# Patient Record
Sex: Female | Born: 1975
Health system: Southern US, Community
[De-identification: ages and names within clinical notes are randomized; demographics above are authoritative.]

## PROBLEM LIST (undated history)

## (undated) DIAGNOSIS — E119 Type 2 diabetes mellitus without complications: Secondary | ICD-10-CM

## (undated) DIAGNOSIS — R87619 Unspecified abnormal cytological findings in specimens from cervix uteri: Secondary | ICD-10-CM

## (undated) DIAGNOSIS — IMO0002 Reserved for concepts with insufficient information to code with codable children: Secondary | ICD-10-CM

## (undated) DIAGNOSIS — Z808 Family history of malignant neoplasm of other organs or systems: Secondary | ICD-10-CM

## (undated) DIAGNOSIS — D649 Anemia, unspecified: Secondary | ICD-10-CM

## (undated) HISTORY — PX: ADENOIDECTOMY: SHX5191

## (undated) HISTORY — PX: MOUTH SURGERY: SHX715

## (undated) HISTORY — DX: Type 2 diabetes mellitus without complications: E11.9

## (undated) HISTORY — PX: LEEP: SHX91

## (undated) HISTORY — DX: Family history of malignant neoplasm of other organs or systems: Z80.8

## (undated) HISTORY — DX: Anemia, unspecified: D64.9

## (undated) HISTORY — PX: COLPOSCOPY: SHX161

---

## 1998-03-13 ENCOUNTER — Ambulatory Visit (HOSPITAL_COMMUNITY): Admission: RE | Admit: 1998-03-13 | Discharge: 1998-03-13 | Payer: Self-pay | Admitting: *Deleted

## 1998-04-29 ENCOUNTER — Inpatient Hospital Stay (HOSPITAL_COMMUNITY): Admission: AD | Admit: 1998-04-29 | Discharge: 1998-04-29 | Payer: Self-pay | Admitting: *Deleted

## 1998-05-23 ENCOUNTER — Inpatient Hospital Stay (HOSPITAL_COMMUNITY): Admission: AD | Admit: 1998-05-23 | Discharge: 1998-05-23 | Payer: Self-pay | Admitting: *Deleted

## 1998-05-27 ENCOUNTER — Inpatient Hospital Stay (HOSPITAL_COMMUNITY): Admission: AD | Admit: 1998-05-27 | Discharge: 1998-05-30 | Payer: Self-pay | Admitting: *Deleted

## 1998-08-22 ENCOUNTER — Inpatient Hospital Stay (HOSPITAL_COMMUNITY): Admission: AD | Admit: 1998-08-22 | Discharge: 1998-08-22 | Payer: Self-pay | Admitting: *Deleted

## 1998-11-28 ENCOUNTER — Inpatient Hospital Stay (HOSPITAL_COMMUNITY): Admission: AD | Admit: 1998-11-28 | Discharge: 1998-11-28 | Payer: Self-pay | Admitting: *Deleted

## 1999-02-18 ENCOUNTER — Inpatient Hospital Stay (HOSPITAL_COMMUNITY): Admission: AD | Admit: 1999-02-18 | Discharge: 1999-02-18 | Payer: Self-pay | Admitting: *Deleted

## 1999-02-18 ENCOUNTER — Encounter (INDEPENDENT_AMBULATORY_CARE_PROVIDER_SITE_OTHER): Payer: Self-pay

## 1999-02-18 ENCOUNTER — Encounter: Payer: Self-pay | Admitting: *Deleted

## 1999-02-20 ENCOUNTER — Inpatient Hospital Stay (HOSPITAL_COMMUNITY): Admission: AD | Admit: 1999-02-20 | Discharge: 1999-02-20 | Payer: Self-pay | Admitting: *Deleted

## 1999-06-30 ENCOUNTER — Emergency Department (HOSPITAL_COMMUNITY): Admission: EM | Admit: 1999-06-30 | Discharge: 1999-06-30 | Payer: Self-pay | Admitting: Emergency Medicine

## 1999-07-01 ENCOUNTER — Encounter: Payer: Self-pay | Admitting: Emergency Medicine

## 1999-10-07 ENCOUNTER — Inpatient Hospital Stay (HOSPITAL_COMMUNITY): Admission: AD | Admit: 1999-10-07 | Discharge: 1999-10-07 | Payer: Self-pay | Admitting: *Deleted

## 2000-10-22 ENCOUNTER — Other Ambulatory Visit: Admission: RE | Admit: 2000-10-22 | Discharge: 2000-10-22 | Payer: Self-pay | Admitting: Obstetrics and Gynecology

## 2002-02-01 ENCOUNTER — Other Ambulatory Visit: Admission: RE | Admit: 2002-02-01 | Discharge: 2002-02-01 | Payer: Self-pay | Admitting: Obstetrics and Gynecology

## 2003-01-15 ENCOUNTER — Emergency Department (HOSPITAL_COMMUNITY): Admission: EM | Admit: 2003-01-15 | Discharge: 2003-01-15 | Payer: Self-pay | Admitting: Emergency Medicine

## 2004-10-31 ENCOUNTER — Other Ambulatory Visit: Admission: RE | Admit: 2004-10-31 | Discharge: 2004-10-31 | Payer: Self-pay | Admitting: Obstetrics and Gynecology

## 2005-03-29 ENCOUNTER — Emergency Department (HOSPITAL_COMMUNITY): Admission: EM | Admit: 2005-03-29 | Discharge: 2005-03-29 | Payer: Self-pay | Admitting: Emergency Medicine

## 2008-11-16 ENCOUNTER — Emergency Department (HOSPITAL_COMMUNITY): Admission: EM | Admit: 2008-11-16 | Discharge: 2008-11-16 | Payer: Self-pay | Admitting: Emergency Medicine

## 2010-03-19 ENCOUNTER — Emergency Department (HOSPITAL_COMMUNITY): Admission: EM | Admit: 2010-03-19 | Discharge: 2010-03-19 | Payer: Self-pay | Admitting: Emergency Medicine

## 2010-05-09 ENCOUNTER — Emergency Department (HOSPITAL_COMMUNITY): Admission: EM | Admit: 2010-05-09 | Discharge: 2010-05-10 | Payer: Self-pay | Admitting: Emergency Medicine

## 2010-10-23 LAB — WET PREP, GENITAL
Clue Cells Wet Prep HPF POC: NONE SEEN
Trich, Wet Prep: NONE SEEN
Yeast Wet Prep HPF POC: NONE SEEN

## 2010-10-23 LAB — GC/CHLAMYDIA PROBE AMP, GENITAL
Chlamydia, DNA Probe: NEGATIVE
GC Probe Amp, Genital: NEGATIVE

## 2010-10-23 LAB — URINALYSIS, ROUTINE W REFLEX MICROSCOPIC
Bilirubin Urine: NEGATIVE
Nitrite: NEGATIVE
Specific Gravity, Urine: 1.014 (ref 1.005–1.030)
pH: 6.5 (ref 5.0–8.0)

## 2010-10-23 LAB — HEMOCCULT GUIAC POC 1CARD (OFFICE): Fecal Occult Bld: POSITIVE

## 2011-08-28 ENCOUNTER — Encounter (HOSPITAL_COMMUNITY): Payer: Self-pay | Admitting: *Deleted

## 2011-08-28 ENCOUNTER — Inpatient Hospital Stay (HOSPITAL_COMMUNITY)
Admission: AD | Admit: 2011-08-28 | Discharge: 2011-08-28 | Disposition: A | Discharge status: Home / Self Care | Payer: Medicaid Other | Source: Ambulatory Visit | Attending: Obstetrics and Gynecology | Admitting: Obstetrics and Gynecology

## 2011-08-28 DIAGNOSIS — N938 Other specified abnormal uterine and vaginal bleeding: Secondary | ICD-10-CM | POA: Insufficient documentation

## 2011-08-28 DIAGNOSIS — N949 Unspecified condition associated with female genital organs and menstrual cycle: Secondary | ICD-10-CM | POA: Insufficient documentation

## 2011-08-28 HISTORY — DX: Unspecified abnormal cytological findings in specimens from cervix uteri: R87.619

## 2011-08-28 HISTORY — DX: Reserved for concepts with insufficient information to code with codable children: IMO0002

## 2011-08-28 LAB — CBC
HCT: 42.9 % (ref 36.0–46.0)
Platelets: 345 10*3/uL (ref 150–400)
RDW: 13.3 % (ref 11.5–15.5)
WBC: 7.7 10*3/uL (ref 4.0–10.5)

## 2011-08-28 LAB — TSH: TSH: 0.637 u[IU]/mL (ref 0.350–4.500)

## 2011-08-28 LAB — HEPATITIS B SURFACE ANTIGEN: Hepatitis B Surface Ag: NEGATIVE

## 2011-08-28 MED ORDER — METRONIDAZOLE 500 MG PO TABS: 500.0000 mg | ORAL_TABLET | Freq: Two times a day (BID) | ORAL | Status: AC

## 2011-08-28 MED ORDER — FLUCONAZOLE 200 MG PO TABS: 200.0000 mg | ORAL_TABLET | Freq: Every day | ORAL | Status: AC

## 2011-08-28 NOTE — Progress Notes (Signed)
Patient states the week of Christmas missed 2 days of her BCP. On 12-30 started her period that lasted until 1-3, stopped bleeding for 2 days then started again on 1-6 and lasted until 1-9. Started again on 1-11 like a full period, stopped 1-15 and started again on 1-16. Has been bleeding since and has decreased some today. Having some period like cramping. Some of the bleeding episodes have been heavy, scant today.

## 2011-08-28 NOTE — ED Provider Notes (Signed)
History   Patient reports amenorrhea from April 2012, when she started Micronor after D/C [place], until 08/09/11. Her bleeding has been on/off since then with episodes that are heavier. Today bleeding has stopped. Also reports extra-marital encounter and desires STD screening.  Chief Complaint  Patient presents with  . Vaginal Bleeding   Vaginal Bleeding The patient's primary symptoms include a missed menses and vaginal bleeding.    Pertinent Gynecological History: Bleeding: intermenstrual bleeding Contraception: Micronor DES exposure: denies Blood transfusions: none Sexually transmitted diseases: past history: HSV 2 Previous GYN Procedures: colpo   Last pap: normal   Past Medical History  Diagnosis Date  . Abnormal Pap smear   . Genital herpes     Past Surgical History  Procedure Date  . Colposcopy   . Adenoidectomy   . Mouth surgery   . Leep   . Cesarean section     Family History  Problem Relation Age of Onset  . Anesthesia problems Neg Hx     History  Substance Use Topics  . Smoking status: Current Everyday Smoker -- 1.0 packs/day  . Smokeless tobacco: Never Used  . Alcohol Use: Yes     Rare    Allergies:  Allergies  Allergen Reactions  . Ibuprofen Nausea Only    Abdominal pains.    Prescriptions prior to admission  Medication Sig Dispense Refill  . guaiFENesin (ROBITUSSIN) 100 MG/5ML liquid Take 200 mg by mouth 3 (three) times daily as needed. For cold and flu symptoms.      . norethindrone (ORTHO MICRONOR) 0.35 MG tablet Take 1 tablet by mouth daily.        Review of Systems  HENT: Negative.   Cardiovascular: Negative.   Gastrointestinal: Negative.   Genitourinary: Positive for vaginal bleeding and missed menses.  Skin: Negative.    Physical Exam   Blood pressure 137/89, pulse 113, temperature 98.3 F (36.8 C), temperature source Oral, resp. rate 16, height 5\' 7"  (1.702 m), weight 81.557 kg (179 lb 12.8 oz), SpO2 99.00%.  Physical  Exam  Constitutional: She is oriented to person, place, and time. She appears well-developed.  Neck: No thyromegaly present.  Cardiovascular: Normal rate, regular rhythm and normal heart sounds.   Respiratory: Breath sounds normal.  GI: Soft. Bowel sounds are normal.  Genitourinary: Vagina normal and uterus normal.  Neurological: She is oriented to person, place, and time.  Psychiatric: She has a normal mood and affect.    MAU Course  Procedures     Assessment and Plan  DUB probably secondary to missed pills and increased stress  Wet prep, GC,Chlamydia collected Will check CBC,TSH,RPR,HbSag and HIV Reviewed options for contraception: pt interested in [place]. Risks and benefits reviewed. Pt to follow-up in office for ultrasound and possibly Mirena insertion  Jood Retana A 08/28/2011, 4:35 PM

## 2011-08-29 LAB — GC/CHLAMYDIA PROBE AMP, GENITAL
Chlamydia, DNA Probe: NEGATIVE
GC Probe Amp, Genital: NEGATIVE

## 2011-10-13 ENCOUNTER — Encounter (INDEPENDENT_AMBULATORY_CARE_PROVIDER_SITE_OTHER): Payer: Medicaid Other | Admitting: Obstetrics and Gynecology

## 2011-10-13 DIAGNOSIS — Z0000 Encounter for general adult medical examination without abnormal findings: Secondary | ICD-10-CM

## 2011-10-13 DIAGNOSIS — Z3040 Encounter for surveillance of contraceptives, unspecified: Secondary | ICD-10-CM

## 2012-01-05 ENCOUNTER — Other Ambulatory Visit (INDEPENDENT_AMBULATORY_CARE_PROVIDER_SITE_OTHER): Payer: Medicaid Other

## 2012-01-05 DIAGNOSIS — Z3009 Encounter for other general counseling and advice on contraception: Secondary | ICD-10-CM

## 2012-01-05 MED ORDER — MEDROXYPROGESTERONE ACETATE 150 MG/ML IM SUSP
150.0000 mg | Freq: Once | INTRAMUSCULAR | Status: AC
  Administered 2012-01-05: 150 mg via INTRAMUSCULAR

## 2012-01-05 NOTE — Progress Notes (Unsigned)
Next Depo due-03-28-12

## 2012-01-07 ENCOUNTER — Telehealth: Payer: Self-pay | Admitting: Obstetrics and Gynecology

## 2012-01-07 NOTE — Telephone Encounter (Signed)
Triage/epic 

## 2012-01-08 NOTE — Telephone Encounter (Signed)
TC to pt.   States had eval at another MD. Apology given for not being able to schedule appt.

## 2012-01-14 ENCOUNTER — Encounter: Payer: Self-pay | Admitting: Obstetrics and Gynecology

## 2012-01-14 ENCOUNTER — Ambulatory Visit (INDEPENDENT_AMBULATORY_CARE_PROVIDER_SITE_OTHER): Payer: Medicaid Other | Admitting: Obstetrics and Gynecology

## 2012-01-14 VITALS — BP 114/80 | Temp 98.2°F | Ht 67.0 in | Wt 187.0 lb

## 2012-01-14 DIAGNOSIS — B373 Candidiasis of vulva and vagina: Secondary | ICD-10-CM

## 2012-01-14 DIAGNOSIS — R6889 Other general symptoms and signs: Secondary | ICD-10-CM

## 2012-01-14 DIAGNOSIS — IMO0002 Reserved for concepts with insufficient information to code with codable children: Secondary | ICD-10-CM | POA: Insufficient documentation

## 2012-01-14 DIAGNOSIS — B3731 Acute candidiasis of vulva and vagina: Secondary | ICD-10-CM

## 2012-01-14 DIAGNOSIS — N898 Other specified noninflammatory disorders of vagina: Secondary | ICD-10-CM

## 2012-01-14 LAB — POCT WET PREP (WET MOUNT)
Whiff Test: POSITIVE
pH: 5

## 2012-01-14 LAB — POCT OSOM TRICHOMONAS RAPID TEST: Trichomonas vaginalis: NEGATIVE

## 2012-01-14 MED ORDER — FLUCONAZOLE 150 MG PO TABS: 150.0000 mg | ORAL_TABLET | Freq: Once | ORAL | Status: AC

## 2012-01-14 NOTE — Patient Instructions (Signed)
Avoid: - excess soap on genital area (consider using plain oatmeal soap) - use of powder or sprays in genital area - douching - wearing underwear to bed (except with menses) - using more than is directed detergent when washing clothes - tight fitting garments around genital area - excess sugar intake   

## 2012-01-14 NOTE — Progress Notes (Signed)
36 YO with previous diagnosis for trichomoniasis presents for test of cure. Admits to some continued itching but admits to douching and using a different cleanser for vaginal area. Completed 7 d course of Metronidazole.  O: pH  5.0;  whiff +;  yeast      OSOM (trich)- negative  Pelvic: EGBUS- mild erythema, vagina-copious thin creamy white discharge  A: Yeast Vaginits  P; Diflucan 150 mg #1 1 po stat 1 rf   Ruth Amalfitano, PA-C

## 2012-01-27 ENCOUNTER — Other Ambulatory Visit: Payer: Self-pay

## 2012-01-27 MED ORDER — VALACYCLOVIR HCL 500 MG PO TABS: 500.0000 mg | ORAL_TABLET | Freq: Every day | ORAL | Status: AC

## 2012-03-28 ENCOUNTER — Other Ambulatory Visit: Payer: Medicaid Other

## 2012-04-01 ENCOUNTER — Other Ambulatory Visit (INDEPENDENT_AMBULATORY_CARE_PROVIDER_SITE_OTHER): Payer: Self-pay

## 2012-04-01 DIAGNOSIS — Z3009 Encounter for other general counseling and advice on contraception: Secondary | ICD-10-CM

## 2012-04-01 MED ORDER — MEDROXYPROGESTERONE ACETATE 150 MG/ML IM SUSP
150.0000 mg | Freq: Once | INTRAMUSCULAR | Status: AC
  Administered 2012-04-01: 150 mg via INTRAMUSCULAR

## 2012-04-01 NOTE — Progress Notes (Unsigned)
Next Depo due-06-23-2012

## 2012-06-24 ENCOUNTER — Other Ambulatory Visit (INDEPENDENT_AMBULATORY_CARE_PROVIDER_SITE_OTHER): Payer: BC Managed Care – PPO

## 2012-06-24 DIAGNOSIS — Z3009 Encounter for other general counseling and advice on contraception: Secondary | ICD-10-CM

## 2012-06-24 MED ORDER — MEDROXYPROGESTERONE ACETATE 150 MG/ML IM SUSP
150.0000 mg | Freq: Once | INTRAMUSCULAR | Status: AC
  Administered 2012-06-24: 150 mg via INTRAMUSCULAR

## 2012-06-24 NOTE — Progress Notes (Unsigned)
Next Depo due 09-15-2012

## 2012-09-16 ENCOUNTER — Other Ambulatory Visit: Payer: BC Managed Care – PPO

## 2012-09-16 DIAGNOSIS — Z3009 Encounter for other general counseling and advice on contraception: Secondary | ICD-10-CM

## 2012-09-16 MED ORDER — MEDROXYPROGESTERONE ACETATE 150 MG/ML IM SUSP
150.0000 mg | Freq: Once | INTRAMUSCULAR | Status: AC
  Administered 2012-09-16: 150 mg via INTRAMUSCULAR

## 2012-09-16 NOTE — Progress Notes (Unsigned)
Next Depo due 12-08-2012

## 2012-10-14 ENCOUNTER — Ambulatory Visit: Payer: BC Managed Care – PPO | Admitting: Obstetrics and Gynecology

## 2013-12-18 ENCOUNTER — Ambulatory Visit: Payer: Self-pay | Admitting: *Deleted

## 2014-01-19 ENCOUNTER — Encounter: Payer: BC Managed Care – PPO | Attending: Internal Medicine | Admitting: *Deleted

## 2014-01-19 ENCOUNTER — Encounter: Payer: Self-pay | Admitting: *Deleted

## 2014-01-19 VITALS — Ht 67.0 in | Wt 201.0 lb

## 2014-01-19 DIAGNOSIS — Z713 Dietary counseling and surveillance: Secondary | ICD-10-CM | POA: Diagnosis not present

## 2014-01-19 DIAGNOSIS — E119 Type 2 diabetes mellitus without complications: Secondary | ICD-10-CM

## 2014-01-19 NOTE — Progress Notes (Signed)
Medical Nutrition Therapy:  Appt start time: 0800 end time:  0900.  Assessment:  Patient here today for diabetes education. Patient newly diagnosed with type 2 diabetes with an A1c of 6.5. Her mother and grandmother also have diabetes. She reports that she has struggled to figure out what she can eat, as many foods have carbohydrates in them. She checks her blood glucose several times a week, but not at specific times. Concentrations range from 79-118, with one reading at 139 immediately after a meal. She has also lost about 11 pounds in the last 3 months, but reports that weight loss is challenging due to Depo Provera injections. 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 Her job requires moderate activity (standing, walking).   MEDICATIONS: Janumet daily, for others, see list.    DIETARY INTAKE:   Usual eating pattern includes 3 meals and 2 snacks per day.  24-hr recall:  B ( AM): Sometimes skips, sausage biscuit OR fruit salad OR bacon, eggs with cheese, occasionally grits, cereal, coffee (Splenda, cream)  Snk ( AM): [employer] granola bar, chips, peanut butter crackers, Cheez its/Triscuits  L ( PM): Sandwich OR leftovers OR salad (pepperoni, fruit, croutons, bacon, crispy noodles, ranch dressing), water (with Crystal light mix) Snk ( PM): Sometimes, same D ( PM): Chicken/fish/hamburger, vegetables (1-2), sometimes starch, water Snk ( PM): None usually Beverages: Water with no calorie flavorings, milk, coffee  Usual physical activity: Treadmill 2 times a week 60 minutes, swimming once a week  Estimated energy needs: 1600 calories 180 g carbohydrates 100 g protein 53 g fat  Progress Towards Goal(s):  In progress.   Nutritional Diagnosis:  NB-1.1 Food and nutrition-related knowledge deficit As related to new diagnosed diabetes.  As evidenced by no prior education.    Intervention:  Nutrition counseling. We discussed basic carb counting, including foods with  carbs, label reading, portion size, and meal planning.   Goals:  1. 2-3 carb servings per meal, 1 serving per snack.  2. Aim for moderate weight loss equalling 5-10% of current weight (180-190 pounds).  3. Monitor portion size of carbohydrate foods 4. Read food label 5. Continue exercising regularly, consider increasing frequency.  Handouts given during visit include:  Living well with diabetes booklet  Meal plan card  Monitoring/Evaluation:  Dietary intake, exercise, blood glucose, and body weight prn.

## 2014-06-11 ENCOUNTER — Encounter: Payer: Self-pay | Admitting: *Deleted

## 2015-11-06 ENCOUNTER — Ambulatory Visit (INDEPENDENT_AMBULATORY_CARE_PROVIDER_SITE_OTHER): Payer: BLUE CROSS/BLUE SHIELD | Admitting: Emergency Medicine

## 2015-11-06 VITALS — BP 130/82 | HR 86 | Temp 98.8°F | Resp 20 | Ht 67.0 in | Wt 211.6 lb

## 2015-11-06 DIAGNOSIS — R509 Fever, unspecified: Secondary | ICD-10-CM

## 2015-11-06 DIAGNOSIS — J0100 Acute maxillary sinusitis, unspecified: Secondary | ICD-10-CM | POA: Diagnosis not present

## 2015-11-06 DIAGNOSIS — R0981 Nasal congestion: Secondary | ICD-10-CM

## 2015-11-06 LAB — POCT INFLUENZA A/B
INFLUENZA A, POC: NEGATIVE
INFLUENZA B, POC: NEGATIVE

## 2015-11-06 MED ORDER — AZELASTINE HCL 0.1 % NA SOLN: 2.0000 | Freq: Two times a day (BID) | NASAL | Status: DC

## 2015-11-06 MED ORDER — AMOXICILLIN 875 MG PO TABS: 875.0000 mg | ORAL_TABLET | Freq: Two times a day (BID) | ORAL | Status: DC

## 2015-11-06 NOTE — Patient Instructions (Addendum)
   IF you received an x-ray today, you will receive an invoice from [facility]. Please contact  Radiology at [phone] with questions or concerns regarding your invoice.   IF you received labwork today, you will receive an invoice from [facility]. Please contact [facility] at [phone] with questions or concerns regarding your invoice.   Our billing staff will not be able to assist you with questions regarding bills from these companies.  You will be contacted with the lab results as soon as they are available. The fastest way to get your results is to activate your My Chart account. Instructions are located on the last page of this paperwork. If you have not heard from us regarding the results in 2 weeks, please contact this office.     Sinusitis, Adult Sinusitis is redness, soreness, and inflammation of the paranasal sinuses. Paranasal sinuses are air pockets within the bones of your face. They are located beneath your eyes, in the middle of your forehead, and above your eyes. In healthy paranasal sinuses, mucus is able to drain out, and air is able to circulate through them by way of your nose. However, when your paranasal sinuses are inflamed, mucus and air can become trapped. This can allow bacteria and other germs to grow and cause infection. Sinusitis can develop quickly and last only a short time (acute) or continue over a long period (chronic). Sinusitis that lasts for more than 12 weeks is considered chronic. CAUSES Causes of sinusitis include:  Allergies.  Structural abnormalities, such as displacement of the cartilage that separates your nostrils (deviated septum), which can decrease the air flow through your nose and sinuses and affect sinus drainage.  Functional abnormalities, such as when the small hairs (cilia) that line your sinuses and help remove mucus do not work properly or are not present. SIGNS AND SYMPTOMS Symptoms  of acute and chronic sinusitis are the same. The primary symptoms are pain and pressure around the affected sinuses. Other symptoms include:  Upper toothache.  Earache.  Headache.  Bad breath.  Decreased sense of smell and taste.  A cough, which worsens when you are lying flat.  Fatigue.  Fever.  Thick drainage from your nose, which often is green and may contain pus (purulent).  Swelling and warmth over the affected sinuses. DIAGNOSIS Your health care provider will perform a physical exam. During your exam, your health care provider may perform any of the following to help determine if you have acute sinusitis or chronic sinusitis:  Look in your nose for signs of abnormal growths in your nostrils (nasal polyps).  Tap over the affected sinus to check for signs of infection.  View the inside of your sinuses using an imaging device that has a light attached (endoscope). If your health care provider suspects that you have chronic sinusitis, one or more of the following tests may be recommended:  Allergy tests.  Nasal culture. A sample of mucus is taken from your nose, sent to a lab, and screened for bacteria.  Nasal cytology. A sample of mucus is taken from your nose and examined by your health care provider to determine if your sinusitis is related to an allergy. TREATMENT Most cases of acute sinusitis are related to a viral infection and will resolve on their own within 10 days. Sometimes, medicines are prescribed to help relieve symptoms of both acute and chronic sinusitis. These may include pain medicines, decongestants, nasal steroid sprays, or saline sprays. However, for sinusitis related   to a bacterial infection, your health care provider will prescribe antibiotic medicines. These are medicines that will help kill the bacteria causing the infection. Rarely, sinusitis is caused by a fungal infection. In these cases, your health care provider will prescribe antifungal  medicine. For some cases of chronic sinusitis, surgery is needed. Generally, these are cases in which sinusitis recurs more than 3 times per year, despite other treatments. HOME CARE INSTRUCTIONS  Drink plenty of water. Water helps thin the mucus so your sinuses can drain more easily.  Use a humidifier.  Inhale steam 3-4 times a day (for example, sit in the bathroom with the shower running).  Apply a warm, moist washcloth to your face 3-4 times a day, or as directed by your health care provider.  Use saline nasal sprays to help moisten and clean your sinuses.  Take medicines only as directed by your health care provider.  If you were prescribed either an antibiotic or antifungal medicine, finish it all even if you start to feel better. SEEK IMMEDIATE MEDICAL CARE IF:  You have increasing pain or severe headaches.  You have nausea, vomiting, or drowsiness.  You have swelling around your face.  You have vision problems.  You have a stiff neck.  You have difficulty breathing.   This information is not intended to replace advice given to you by your health care provider. Make sure you discuss any questions you have with your health care provider.   Document Released: 07/27/2005 Document Revised: 08/17/2014 Document Reviewed: 08/11/2011 Elsevier Interactive Patient Education 2016 [employer]  

## 2015-11-06 NOTE — Progress Notes (Signed)
By signing my name below, I, Stann Oresung-Kai Tsai, attest that this documentation has been prepared under the direction and in the presence of Lesle ChrisSteven Audy Dauphine, MD. Electronically Signed: Stann Oresung-Kai Tsai, Scribe. 11/06/2015 , 12:01 PM .  Patient was seen in room 7 .  Chief Complaint:  Chief Complaint  Patient presents with  . Sinusitis    x 4 days    HPI: Ruth Mckinney is a 40 y.o. female who [facility] complaining of sinus issues that started 4 days ago. She informs she thought her sinus issues are going to clear up but then it continues to come back. She noticed a sharp aching pain in her chest 3 days ago but this has mostly resolved. She mentions having a small virus running a low grade fever with some myalgia a few weeks ago. She denies any myalgia or fever now. She's a current smoker, at about a pack a day, but denies smoking since she's been sick. 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   She also informs that her son was sick but his illness has cleared up.   Past Medical History  Diagnosis Date  . Abnormal Pap smear   . Genital herpes   . Anemia    Past Surgical History  Procedure Laterality Date  . Colposcopy    . Adenoidectomy    . Mouth surgery    . Leep    . Cesarean section     Social History   Social History  . Marital Status: Legally Separated    Spouse Name: N/A  . Number of Children: N/A  . Years of Education: N/A   Social History Main Topics  . Smoking status: Current Every Day Smoker -- 1.00 packs/day  . Smokeless tobacco: Never Used  . Alcohol Use: Yes     Comment: Rare  . Drug Use: None  . Sexual Activity: Yes    Birth Control/ Protection: Pill, Injection     Comment: DEPO PROVERA   Other Topics Concern  . None   Social History Narrative   Family History  Problem Relation Age of Onset  . Anesthesia problems Neg Hx   . Hypertension Maternal Grandmother   . Diabetes Maternal Grandmother   . Heart disease  Maternal Grandfather   . Diabetes Maternal Grandfather   . Hypertension Mother   . Diabetes Mother   . Anemia Mother   . Migraines Mother    Allergies  Allergen Reactions  . Ibuprofen Nausea Only    Abdominal pains.   Prior to Admission medications   Medication Sig Start Date End Date Taking? Authorizing Provider  medroxyPROGESTERone (DEPO-PROVERA) 150 MG/ML injection Inject 150 mg into the muscle every 3 (three) months. Reported on 11/06/2015   Yes Historical Provider, MD  pantoprazole (PROTONIX) 40 MG tablet Take 40 mg by mouth daily.   Yes Historical Provider, MD  buPROPion (WELLBUTRIN XL) 150 MG 24 hr tablet Take 150 mg by mouth daily. Reported on 11/06/2015    Historical Provider, MD  guaiFENesin (ROBITUSSIN) 100 MG/5ML liquid Take 200 mg by mouth 3 (three) times daily as needed. Reported on 11/06/2015    Historical Provider, MD  sitaGLIPtin-metformin (JANUMET) 50-500 MG per tablet Take 1 tablet by mouth daily. Reported on 11/06/2015    Historical Provider, MD     ROS:  Constitutional: negative for fever, chills, night sweats, weight changes, or fatigue  HEENT: negative for vision changes, hearing loss, rhinorrhea, ST, epistaxis; positive for  sinus pressure, congestion Cardiovascular: negative for chest pain or palpitations Respiratory: negative for hemoptysis, wheezing, shortness of breath, or cough Abdominal: negative for abdominal pain, nausea, vomiting, diarrhea, or constipation Dermatological: negative for rash Musc: negative for myalgia Neurologic: negative for headache, dizziness, or syncope All other systems reviewed and are otherwise negative with the exception to those above and in the HPI.  PHYSICAL EXAM: Filed Vitals:   11/06/15 1100  BP: 130/82  Pulse: 86  Temp: 98.8 F (37.1 C)  Resp: 20   Body mass index is 33.13 kg/(m^2).   General: Alert, no acute distress HEENT:  Normocephalic, atraumatic, oropharynx patent; Nasal congestion; minimal tenderness over  maxillary sinuses Eye: [facility], [facility] Cardiovascular:  Regular rate and rhythm, no rubs murmurs or gallops.  No Carotid bruits, radial pulse intact. No pedal edema.  Respiratory: Clear to auscultation bilaterally.  No wheezes, rales, or rhonchi.  No cyanosis, no use of accessory musculature Abdominal: No organomegaly, abdomen is soft and non-tender, positive bowel sounds. No masses. Musculoskeletal: Gait intact. No edema, tenderness Skin: No rashes. Neurologic: Facial musculature symmetric. Psychiatric: Patient acts appropriately throughout our interaction.  Lymphatic: No cervical or submandibular lymphadenopathy Genitourinary/Anorectal: No acute findings  LABS: Results for orders placed or performed in visit on 11/06/15  POCT Influenza A/B  Result Value Ref Range   Influenza A, POC Negative Negative   Influenza B, POC Negative Negative    EKG/XRAY:     ASSESSMENT/PLAN:    We'll treat as a sinusitis. Will treat with  Astelin nasal spray along with amoxicillin.I personally performed the services described in this documentation, which was scribed in my presence. The recorded information has been reviewed and is accurate.I personally performed the services described in this documentation, which was scribed in my presence. The recorded information has been reviewed and is accurate.  Gross sideeffects, risk and benefits, and alternatives of medications d/w patient. Patient is aware that all medications have potential sideeffects and we are unable to predict every sideeffect or drug-drug interaction that may occur.  Lesle Chris MD 11/06/2015 12:02 PM

## 2015-11-26 ENCOUNTER — Ambulatory Visit (INDEPENDENT_AMBULATORY_CARE_PROVIDER_SITE_OTHER): Payer: BLUE CROSS/BLUE SHIELD | Admitting: Family Medicine

## 2015-11-26 ENCOUNTER — Ambulatory Visit (INDEPENDENT_AMBULATORY_CARE_PROVIDER_SITE_OTHER): Payer: BLUE CROSS/BLUE SHIELD

## 2015-11-26 DIAGNOSIS — M2550 Pain in unspecified joint: Secondary | ICD-10-CM | POA: Diagnosis not present

## 2015-11-26 DIAGNOSIS — S161XXA Strain of muscle, fascia and tendon at neck level, initial encounter: Secondary | ICD-10-CM | POA: Diagnosis not present

## 2015-11-26 DIAGNOSIS — M25512 Pain in left shoulder: Secondary | ICD-10-CM

## 2015-11-26 DIAGNOSIS — S39012A Strain of muscle, fascia and tendon of lower back, initial encounter: Secondary | ICD-10-CM

## 2015-11-26 MED ORDER — CYCLOBENZAPRINE HCL 5 MG PO TABS
ORAL_TABLET | ORAL | Status: DC
Start: 2015-11-26 — End: 2019-04-25
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
## 2016-10-26 ENCOUNTER — Other Ambulatory Visit: Payer: Self-pay | Admitting: Obstetrics and Gynecology

## 2016-10-26 DIAGNOSIS — R928 Other abnormal and inconclusive findings on diagnostic imaging of breast: Secondary | ICD-10-CM

## 2016-11-09 ENCOUNTER — Ambulatory Visit
Admission: RE | Admit: 2016-11-09 | Discharge: 2016-11-09 | Disposition: A | Discharge status: Home / Self Care | Payer: BLUE CROSS/BLUE SHIELD | Source: Ambulatory Visit | Attending: Obstetrics and Gynecology | Admitting: Obstetrics and Gynecology

## 2016-11-09 DIAGNOSIS — R928 Other abnormal and inconclusive findings on diagnostic imaging of breast: Secondary | ICD-10-CM

## 2018-01-22 ENCOUNTER — Other Ambulatory Visit: Payer: Self-pay | Admitting: Obstetrics and Gynecology

## 2018-01-22 DIAGNOSIS — N6489 Other specified disorders of breast: Secondary | ICD-10-CM

## 2018-01-26 ENCOUNTER — Other Ambulatory Visit: Payer: BLUE CROSS/BLUE SHIELD

## 2018-05-16 ENCOUNTER — Other Ambulatory Visit: Payer: Self-pay

## 2019-04-14 DIAGNOSIS — Z6834 Body mass index (BMI) 34.0-34.9, adult: Secondary | ICD-10-CM | POA: Diagnosis not present

## 2019-04-14 DIAGNOSIS — N898 Other specified noninflammatory disorders of vagina: Secondary | ICD-10-CM | POA: Diagnosis not present

## 2019-04-14 DIAGNOSIS — Z01419 Encounter for gynecological examination (general) (routine) without abnormal findings: Secondary | ICD-10-CM | POA: Diagnosis not present

## 2019-04-25 ENCOUNTER — Other Ambulatory Visit: Payer: Self-pay

## 2019-04-25 ENCOUNTER — Encounter: Payer: Self-pay | Admitting: Nurse Practitioner

## 2019-04-25 ENCOUNTER — Ambulatory Visit: Payer: BC Managed Care – PPO | Admitting: Nurse Practitioner

## 2019-04-25 VITALS — BP 132/80 | HR 101 | Temp 98.3°F | Ht 68.0 in | Wt 218.2 lb

## 2019-04-25 DIAGNOSIS — R631 Polydipsia: Secondary | ICD-10-CM | POA: Diagnosis not present

## 2019-04-25 DIAGNOSIS — R202 Paresthesia of skin: Secondary | ICD-10-CM

## 2019-04-25 DIAGNOSIS — Z87898 Personal history of other specified conditions: Secondary | ICD-10-CM

## 2019-04-25 DIAGNOSIS — R200 Anesthesia of skin: Secondary | ICD-10-CM | POA: Diagnosis not present

## 2019-04-25 DIAGNOSIS — I10 Essential (primary) hypertension: Secondary | ICD-10-CM | POA: Diagnosis not present

## 2019-04-25 DIAGNOSIS — R358 Other polyuria: Secondary | ICD-10-CM | POA: Diagnosis not present

## 2019-04-25 DIAGNOSIS — E559 Vitamin D deficiency, unspecified: Secondary | ICD-10-CM | POA: Diagnosis not present

## 2019-04-25 DIAGNOSIS — R3589 Other polyuria: Secondary | ICD-10-CM

## 2019-04-25 DIAGNOSIS — Z833 Family history of diabetes mellitus: Secondary | ICD-10-CM

## 2019-04-25 DIAGNOSIS — M542 Cervicalgia: Secondary | ICD-10-CM

## 2019-04-25 LAB — POCT URINALYSIS DIPSTICK
Bilirubin, UA: NEGATIVE
Glucose, UA: POSITIVE — AB
Leukocytes, UA: NEGATIVE
Nitrite, UA: NEGATIVE
Protein, UA: NEGATIVE
Spec Grav, UA: 1.01 (ref 1.010–1.025)
Urobilinogen, UA: 0.2 E.U./dL
pH, UA: 5.5 (ref 5.0–8.0)

## 2019-04-25 LAB — POCT UA - MICROALBUMIN
Albumin/Creatinine Ratio, Urine, POC: 300
Creatinine, POC: 200 mg/dL
Microalbumin Ur, POC: 80 mg/L

## 2019-04-25 NOTE — Progress Notes (Signed)
Subjective:     Patient ID: Ruth Mckinney , female    DOB: 02-20-1976 , 43 y.o.   MRN: [id number]   Chief Complaint  Patient presents with  . Establish Care  . family history of diabetes    patient stated a couple years ago she was prediabetic but she changed her diet and lost some weight so she was no longer considerated prediabetic. she started experincing some symptoms recently. she states she has been extremely thirsty, fatigued, vision has become blurry and has had some nerve pain    HPI  She is here to re-establish care had seen Dr. Baird Cancer years ago. Has not seen primary care in several years.  She goes to [facility] seen last year, no pap. On Depo. 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  She does feel she has gained an increase in weight.  She does report having a 20lb weight loss over the last year.  In the last year she has come off the road.  Children 24 and 21.  Single. Has never taken any blood pressure medications in th past.   PMH - prediabetic (took metformin, diet and exercise and did not have to take).  [facility] - mother diabetes, maternal grandmother and grandfather diabetes, uncles with diabetes.  Maternal grandfather with 4 heart attacks with 2 open heart surgeries with the last one triple bypass.  Diabetes She presents for her initial diabetic visit. She has type 2 diabetes mellitus. There are no hypoglycemic associated symptoms. Pertinent negatives for hypoglycemia include no dizziness, headaches, hunger, mood changes or speech difficulty. Associated symptoms include polydipsia, polyphagia and polyuria. Pertinent negatives for diabetes include no fatigue and no weakness. (Bilateral feet swelling.  These symptoms have occurred over the last 3-4 months.) There are no hypoglycemic complications. Pertinent negatives for diabetic complications include no peripheral neuropathy. Risk factors for coronary artery disease include obesity, sedentary lifestyle  and hypertension. When asked about current treatments, none were reported. She has not had a previous visit with a dietitian. ACE inhibitor/angiotensin II receptor blocker: not taking a medication. She does not see a podiatrist.Eye exam is not current.  Neck Pain  This is a chronic problem. The current episode started more than 1 month ago (since march 2020). The problem has been gradually worsening. The pain is present in the anterior neck. Stiffness is present in the morning. Associated symptoms include numbness (right 2nd and 3rd finger). Pertinent negatives include no headaches, leg pain, tingling or weakness. She has tried nothing for the symptoms.     Past Medical History:  Diagnosis Date  . Abnormal Pap smear   . Anemia   . Genital herpes      Family History  Problem Relation Age of Onset  . Hypertension Maternal Grandmother   . Diabetes Maternal Grandmother   . Heart disease Maternal Grandfather   . Diabetes Maternal Grandfather   . Hypertension Mother   . Diabetes Mother   . Anemia Mother   . Migraines Mother   . Anesthesia problems Neg Hx      Current Outpatient Medications:  .  medroxyPROGESTERone (DEPO-PROVERA) 150 MG/ML injection, Inject 150 mg into the muscle every 3 (three) months. Reported on 11/06/2015, Disp: , Rfl:  .  valACYclovir (VALTREX) 500 MG tablet, Take 500 mg by mouth 2 (two) times daily. For 7 days as needed, Disp: , Rfl:    Allergies  Allergen Reactions  . Ibuprofen Nausea Only    Abdominal pains.  Review of Systems  Constitutional: Negative for fatigue.  Respiratory: Negative.   Cardiovascular: Negative.   Endocrine: Positive for polydipsia, polyphagia and polyuria.  Musculoskeletal: Positive for neck pain. Negative for arthralgias, back pain, gait problem, myalgias and neck stiffness.  Neurological: Positive for numbness (right 2nd and 3rd finger). Negative for dizziness, tingling, speech difficulty, weakness and headaches.     Today's  Vitals   04/25/19 1146 04/25/19 1203  BP: (!) 150/100 132/80  Pulse: 98 (!) 101  Temp: 98.3 F (36.8 C)   TempSrc: Oral   SpO2: 98%   Weight: 218 lb 3.2 oz (99 kg)   Height: 5\' 8"  (1.727 m)   PainSc: 0-No pain    Body mass index is 33.18 kg/m.   Objective:  Physical Exam Constitutional:      General: She is not in acute distress.    Appearance: Normal appearance.  Cardiovascular:     Rate and Rhythm: Normal rate and regular rhythm.     Pulses: Normal pulses.     Heart sounds: Normal heart sounds. No murmur.  Pulmonary:     Effort: Pulmonary effort is normal.     Breath sounds: Normal breath sounds.  Skin:    Capillary Refill: Capillary refill takes less than 2 seconds.  Neurological:     General: No focal deficit present.     Mental Status: She is alert and oriented to person, place, and time.     Comments: Negative phalen and tinels sign  Psychiatric:        Mood and Affect: Mood normal.        Behavior: Behavior normal.        Thought Content: Thought content normal.        Judgment: Judgment normal.         Assessment And Plan:     1. Polyuria  Will check HgbA1c to evaluate for diabetes  Encouraged to make sure she is staying well hydrated - Hemoglobin A1c - CMP14 + Anion Gap  2. Polydipsia  See #1 - Hemoglobin A1c - CMP14 + Anion Gap  3. Elevated blood pressure reading in office with diagnosis of hypertension  This is her first office visit here at this office which can potentially cause her blood pressure to be elevated  Will not start a blood pressure medication at this time  She is to check her blood pressure over the next few days - CBC no Diff  4. Neck pain  Tension to neck muscles on palpation  Will check neck xray for structural damage - DG Cervical Spine Complete; Future  5. Numbness  Complaining of numbness to index and middle finger  Will check for metabolic causes  Negative phalen and tinel signs - Vitamin D (25  hydroxy) - TSH - Vitamin B12   Ruth FeltsJanece Raygan Skarda, FNP    THE PATIENT IS ENCOURAGED TO PRACTICE SOCIAL DISTANCING DUE TO THE COVID-19 PANDEMIC.

## 2019-04-26 ENCOUNTER — Other Ambulatory Visit: Payer: Self-pay

## 2019-04-26 LAB — CMP14 + ANION GAP
ALT: 33 IU/L — ABNORMAL HIGH (ref 0–32)
AST: 22 IU/L (ref 0–40)
Albumin/Globulin Ratio: 1.9 (ref 1.2–2.2)
Albumin: 4.8 g/dL (ref 3.8–4.8)
Alkaline Phosphatase: 124 IU/L — ABNORMAL HIGH (ref 39–117)
Anion Gap: 15 mmol/L (ref 10.0–18.0)
BUN/Creatinine Ratio: 9 (ref 9–23)
BUN: 8 mg/dL (ref 6–24)
Bilirubin Total: 0.3 mg/dL (ref 0.0–1.2)
CO2: 20 mmol/L (ref 20–29)
Calcium: 10.8 mg/dL — ABNORMAL HIGH (ref 8.7–10.2)
Chloride: 101 mmol/L (ref 96–106)
Creatinine, Ser: 0.85 mg/dL (ref 0.57–1.00)
GFR calc Af Amer: 96 mL/min/{1.73_m2} (ref >59)
GFR calc non Af Amer: 84 mL/min/{1.73_m2} (ref >59)
Globulin, Total: 2.5 g/dL (ref 1.5–4.5)
Glucose: 309 mg/dL — ABNORMAL HIGH (ref 65–99)
Potassium: 4.5 mmol/L (ref 3.5–5.2)
Sodium: 136 mmol/L (ref 134–144)
Total Protein: 7.3 g/dL (ref 6.0–8.5)

## 2019-04-26 LAB — CBC
Hematocrit: 44.2 % (ref 34.0–46.6)
Hemoglobin: 14.3 g/dL (ref 11.1–15.9)
MCH: 30.4 pg (ref 26.6–33.0)
MCHC: 32.4 g/dL (ref 31.5–35.7)
MCV: 94 fL (ref 79–97)
Platelets: 272 10*3/uL (ref 150–450)
RBC: 4.71 x10E6/uL (ref 3.77–5.28)
RDW: 11.8 % (ref 11.7–15.4)
WBC: 4.4 10*3/uL (ref 3.4–10.8)

## 2019-04-26 LAB — VITAMIN D 25 HYDROXY (VIT D DEFICIENCY, FRACTURES): Vit D, 25-Hydroxy: 14 ng/mL — ABNORMAL LOW (ref 30.0–100.0)

## 2019-04-26 LAB — HEMOGLOBIN A1C
Est. average glucose Bld gHb Est-mCnc: 303 mg/dL
Hgb A1c MFr Bld: 12.2 % — ABNORMAL HIGH (ref 4.8–5.6)

## 2019-04-26 LAB — VITAMIN B12: Vitamin B-12: 427 pg/mL (ref 232–1245)

## 2019-04-26 LAB — TSH: TSH: 1.13 u[IU]/mL (ref 0.450–4.500)

## 2019-04-26 MED ORDER — METFORMIN HCL 500 MG PO TABS: 500.0000 mg | ORAL_TABLET | Freq: Two times a day (BID) | ORAL | 2 refills | Status: DC

## 2019-04-26 MED ORDER — TRESIBA FLEXTOUCH 100 UNIT/ML ~~LOC~~ SOPN: 10.0000 [IU] | PEN_INJECTOR | Freq: Every day | SUBCUTANEOUS | 0 refills | Status: DC

## 2019-04-26 MED ORDER — BLOOD GLUCOSE MONITORING SUPPL DEVI: 0 refills | Status: DC

## 2019-04-26 MED ORDER — VITAMIN D (ERGOCALCIFEROL) 1.25 MG (50000 UNIT) PO CAPS: 50000.0000 [IU] | ORAL_CAPSULE | ORAL | 0 refills | Status: DC

## 2019-04-26 MED ORDER — OZEMPIC (0.25 OR 0.5 MG/DOSE) 2 MG/1.5ML ~~LOC~~ SOPN: 0.5000 mg | PEN_INJECTOR | SUBCUTANEOUS | 0 refills | Status: DC

## 2019-04-27 ENCOUNTER — Ambulatory Visit (INDEPENDENT_AMBULATORY_CARE_PROVIDER_SITE_OTHER): Payer: BC Managed Care – PPO | Admitting: Nurse Practitioner

## 2019-04-27 ENCOUNTER — Encounter: Payer: Self-pay | Admitting: Nurse Practitioner

## 2019-04-27 ENCOUNTER — Other Ambulatory Visit: Payer: Self-pay

## 2019-04-27 ENCOUNTER — Other Ambulatory Visit: Payer: Self-pay | Admitting: Nurse Practitioner

## 2019-04-27 VITALS — BP 130/78 | HR 99 | Temp 98.0°F | Ht 68.0 in | Wt 218.0 lb

## 2019-04-27 DIAGNOSIS — E119 Type 2 diabetes mellitus without complications: Secondary | ICD-10-CM | POA: Diagnosis not present

## 2019-04-27 MED ORDER — ONETOUCH ULTRASOFT LANCETS MISC: 12 refills | Status: DC

## 2019-04-27 MED ORDER — VITAMIN D (ERGOCALCIFEROL) 1.25 MG (50000 UNIT) PO CAPS: 50000.0000 [IU] | ORAL_CAPSULE | ORAL | 0 refills | Status: DC

## 2019-04-27 MED ORDER — ONETOUCH VERIO VI STRP: ORAL_STRIP | 12 refills | Status: DC

## 2019-05-02 ENCOUNTER — Other Ambulatory Visit: Payer: Self-pay | Admitting: Nurse Practitioner

## 2019-05-02 ENCOUNTER — Other Ambulatory Visit: Payer: Self-pay

## 2019-05-02 MED ORDER — TRESIBA FLEXTOUCH 100 UNIT/ML ~~LOC~~ SOPN: 10.0000 [IU] | PEN_INJECTOR | Freq: Every day | SUBCUTANEOUS | 0 refills | Status: DC

## 2019-05-04 ENCOUNTER — Other Ambulatory Visit: Payer: Self-pay

## 2019-05-04 ENCOUNTER — Telehealth: Payer: Self-pay

## 2019-05-04 MED ORDER — TRESIBA FLEXTOUCH 100 UNIT/ML ~~LOC~~ SOPN: 10.0000 [IU] | PEN_INJECTOR | Freq: Every day | SUBCUTANEOUS | 0 refills | Status: DC

## 2019-05-04 NOTE — Telephone Encounter (Signed)
Patient's PA for one touch verio test strips has been submitted through covermymeds. YRL,RMA

## 2019-05-08 ENCOUNTER — Other Ambulatory Visit: Payer: Self-pay

## 2019-05-08 MED ORDER — ONETOUCH DELICA LANCETS 33G MISC: 1.0000 | Freq: Two times a day (BID) | 5 refills | Status: DC

## 2019-05-10 ENCOUNTER — Other Ambulatory Visit: Payer: Self-pay

## 2019-05-10 MED ORDER — CONTOUR MONITOR W/DEVICE KIT: 1.0000 | PACK | Freq: Two times a day (BID) | 1 refills | Status: DC

## 2019-05-10 NOTE — Progress Notes (Signed)
con

## 2019-06-01 ENCOUNTER — Other Ambulatory Visit: Payer: Self-pay | Admitting: Nurse Practitioner

## 2019-06-01 MED ORDER — OZEMPIC (0.25 OR 0.5 MG/DOSE) 2 MG/1.5ML ~~LOC~~ SOPN: 0.5000 mg | PEN_INJECTOR | SUBCUTANEOUS | 0 refills | Status: DC

## 2019-06-08 ENCOUNTER — Other Ambulatory Visit: Payer: Self-pay

## 2019-06-08 MED ORDER — OZEMPIC (0.25 OR 0.5 MG/DOSE) 2 MG/1.5ML ~~LOC~~ SOPN: 0.5000 mg | PEN_INJECTOR | SUBCUTANEOUS | Status: DC

## 2019-06-22 ENCOUNTER — Ambulatory Visit: Payer: BC Managed Care – PPO | Admitting: Nurse Practitioner

## 2019-06-22 ENCOUNTER — Encounter: Payer: Self-pay | Admitting: Nurse Practitioner

## 2019-06-22 ENCOUNTER — Other Ambulatory Visit: Payer: Self-pay

## 2019-06-22 VITALS — BP 122/80 | HR 93 | Temp 98.5°F | Ht 67.8 in | Wt 223.6 lb

## 2019-06-22 DIAGNOSIS — E559 Vitamin D deficiency, unspecified: Secondary | ICD-10-CM

## 2019-06-22 DIAGNOSIS — E119 Type 2 diabetes mellitus without complications: Secondary | ICD-10-CM

## 2019-06-22 MED ORDER — OZEMPIC (1 MG/DOSE) 2 MG/1.5ML ~~LOC~~ SOPN
1.0000 mg | PEN_INJECTOR | SUBCUTANEOUS | 1 refills | Status: DC
Start: 2019-06-22 — End: 2019-09-13

## 2019-06-22 NOTE — Progress Notes (Signed)
Subjective:     Patient ID: Ruth Mckinney , female    DOB: 09-14-1975 , 43 y.o.   MRN: [id number]   Chief Complaint  Patient presents with  . Diabetes    HPI  Wt Readings from Last 3 Encounters: 06/22/19 : 223 lb 9.6 oz (101.4 kg) 04/27/19 : 218 lb (98.9 kg) 04/25/19 : 218 lb 3.2 oz (99 kg)  She has decreased her intake of carbohydrates. She has not been checking her blood sugar regularly.  She is currently on Ozempic 0.68m weekly.  She feels like she has lost inches.   Diabetes She presents for her initial diabetic visit. She has type 2 diabetes mellitus. There are no hypoglycemic associated symptoms. Pertinent negatives for hypoglycemia include no dizziness, hunger, mood changes or speech difficulty. Associated symptoms include polydipsia, polyphagia and polyuria. Pertinent negatives for diabetes include no fatigue. (Increased urination and thirst has improved.  ) There are no hypoglycemic complications. Pertinent negatives for diabetic complications include no peripheral neuropathy. Risk factors for coronary artery disease include obesity, sedentary lifestyle and hypertension. Current diabetic treatment includes oral agent (dual therapy) (she only took the first 30 days of metformin continues to be on [place] she will get full more quickly). She is compliant with treatment most of the time. She has not had a previous visit with a dietitian. An ACE inhibitor/angiotensin II receptor blocker is being taken (not taking a medication). She does not see a podiatrist.Eye exam is not current.     Past Medical History:  Diagnosis Date  . Abnormal Pap smear   . Anemia   . Genital herpes      Family History  Problem Relation Age of Onset  . Hypertension Maternal Grandmother   . Diabetes Maternal Grandmother   . Heart disease Maternal Grandfather   . Diabetes Maternal Grandfather   . Hypertension Mother   . Diabetes Mother   . Anemia Mother   . Migraines Mother   .  Anesthesia problems Neg Hx      Current Outpatient Medications:  .  Blood Glucose Monitoring Suppl (CONTOUR MONITOR) w/Device KIT, 1 Stick by Does not apply route 2 (two) times daily., Disp: 1 kit, Rfl: 1 .  Blood Glucose Monitoring Suppl DEVI, Check blood sugars twice daily, Disp: 1 kit, Rfl: 0 .  glucose blood (ONETOUCH VERIO) test strip, Use as instructed, Disp: 100 each, Rfl: 12 .  insulin degludec (TRESIBA FLEXTOUCH) 100 UNIT/ML SOPN FlexTouch Pen, Inject 0.1 mLs (10 Units total) into the skin at bedtime., Disp: 5 pen, Rfl: 0 .  Lancets (ONETOUCH ULTRASOFT) lancets, Use as instructed, Disp: 100 each, Rfl: 12 .  medroxyPROGESTERone (DEPO-PROVERA) 150 MG/ML injection, Inject 150 mg into the muscle every 3 (three) months. Reported on 11/06/2015, Disp: , Rfl:  .  OneTouch Delica Lancets 340JMISC, 1 Stick by Does not apply route 2 (two) times daily at 10 AM and 5 PM., Disp: 100 each, Rfl: 5 .  Semaglutide,0.25 or 0.5MG/DOS, (OZEMPIC, 0.25 OR 0.5 MG/DOSE,) 2 MG/1.5ML SOPN, Inject 0.5 mg into the skin once a week., Disp: 5 pen, Rfl: 01 .  valACYclovir (VALTREX) 500 MG tablet, Take 500 mg by mouth 2 (two) times daily. For 7 days as needed, Disp: , Rfl:  .  Vitamin D, Ergocalciferol, (DRISDOL) 1.25 MG (50000 UT) CAPS capsule, Take 1 capsule (50,000 Units total) by mouth 2 (two) times a week., Disp: 24 capsule, Rfl: 0 .  metFORMIN (GLUCOPHAGE) 500 MG tablet, Take 1 tablet (500 mg  total) by mouth 2 (two) times daily with a meal. (Patient not taking: Reported on 06/22/2019), Disp: 60 tablet, Rfl: 2   Allergies  Allergen Reactions  . Ibuprofen Nausea Only    Abdominal pains.     Review of Systems  Constitutional: Negative for fatigue.  Respiratory: Negative.   Cardiovascular: Negative.   Endocrine: Positive for polydipsia, polyphagia and polyuria.  Musculoskeletal: Negative for arthralgias, back pain, gait problem, myalgias and neck stiffness.  Neurological: Negative for dizziness and speech  difficulty.     Today's Vitals   06/22/19 1402  BP: 122/80  Pulse: 93  Temp: 98.5 F (36.9 C)  TempSrc: Oral  Weight: 223 lb 9.6 oz (101.4 kg)  Height: 5' 7.8" (1.722 m)  PainSc: 0-No pain   Body mass index is 34.2 kg/m.   Objective:  Physical Exam Constitutional:      General: She is not in acute distress.    Appearance: Normal appearance.  Cardiovascular:     Rate and Rhythm: Normal rate and regular rhythm.     Pulses: Normal pulses.     Heart sounds: Normal heart sounds. No murmur.  Pulmonary:     Effort: Pulmonary effort is normal.     Breath sounds: Normal breath sounds.  Skin:    Capillary Refill: Capillary refill takes less than 2 seconds.  Neurological:     General: No focal deficit present.     Mental Status: She is alert and oriented to person, place, and time.     Comments: Negative phalen and tinels sign  Psychiatric:        Mood and Affect: Mood normal.        Behavior: Behavior normal.        Thought Content: Thought content normal.        Judgment: Judgment normal.         Assessment And Plan:     1. Type 2 diabetes mellitus without complication, without long-term current use of insulin ([place])  She is tolerating [place] and Ozempic well  Will check HgbA1c today  Encouraged to check blood sugar at least once daily  Encouraged to eat 4-5 small meals a day with low fat - Hemoglobin A1c - CMP14+EGFR  2. Vitamin D deficiency  Will check vitamin D level and supplement as needed.     Also encouraged to spend 15 minutes in the sun daily.  - Vitamin D (25 hydroxy)         Minette Brine, FNP    THE PATIENT IS ENCOURAGED TO PRACTICE SOCIAL DISTANCING DUE TO THE COVID-19 PANDEMIC.

## 2019-06-23 LAB — CMP14+EGFR
ALT: 24 IU/L (ref 0–32)
AST: 14 IU/L (ref 0–40)
Albumin/Globulin Ratio: 2 (ref 1.2–2.2)
Albumin: 4.6 g/dL (ref 3.8–4.8)
Alkaline Phosphatase: 118 IU/L — ABNORMAL HIGH (ref 39–117)
BUN/Creatinine Ratio: 13 (ref 9–23)
BUN: 11 mg/dL (ref 6–24)
Bilirubin Total: 0.2 mg/dL (ref 0.0–1.2)
CO2: 20 mmol/L (ref 20–29)
Calcium: 10.5 mg/dL — ABNORMAL HIGH (ref 8.7–10.2)
Chloride: 104 mmol/L (ref 96–106)
Creatinine, Ser: 0.85 mg/dL (ref 0.57–1.00)
GFR calc Af Amer: 96 mL/min/{1.73_m2} (ref >59)
GFR calc non Af Amer: 84 mL/min/{1.73_m2} (ref >59)
Globulin, Total: 2.3 g/dL (ref 1.5–4.5)
Glucose: 158 mg/dL — ABNORMAL HIGH (ref 65–99)
Potassium: 4.1 mmol/L (ref 3.5–5.2)
Sodium: 139 mmol/L (ref 134–144)
Total Protein: 6.9 g/dL (ref 6.0–8.5)

## 2019-06-23 LAB — HEMOGLOBIN A1C
Est. average glucose Bld gHb Est-mCnc: 229 mg/dL
Hgb A1c MFr Bld: 9.6 % — ABNORMAL HIGH (ref 4.8–5.6)

## 2019-06-23 LAB — VITAMIN D 25 HYDROXY (VIT D DEFICIENCY, FRACTURES): Vit D, 25-Hydroxy: 43.3 ng/mL (ref 30.0–100.0)

## 2019-07-27 ENCOUNTER — Ambulatory Visit: Payer: BC Managed Care – PPO | Admitting: Nurse Practitioner

## 2019-07-31 ENCOUNTER — Other Ambulatory Visit: Payer: Self-pay | Admitting: Nurse Practitioner

## 2019-07-31 MED ORDER — VITAMIN D (ERGOCALCIFEROL) 1.25 MG (50000 UNIT) PO CAPS: 50000.0000 [IU] | ORAL_CAPSULE | ORAL | 0 refills | Status: DC

## 2019-09-13 ENCOUNTER — Other Ambulatory Visit: Payer: Self-pay | Admitting: Nurse Practitioner

## 2019-09-13 DIAGNOSIS — E119 Type 2 diabetes mellitus without complications: Secondary | ICD-10-CM

## 2019-10-08 ENCOUNTER — Encounter: Payer: Self-pay | Admitting: Nurse Practitioner

## 2019-10-08 NOTE — Progress Notes (Addendum)
This visit occurred during the SARS-CoV-2 public health emergency.  Safety protocols were in place, including screening questions prior to the visit, additional usage of staff PPE, and extensive cleaning of exam room while observing appropriate contact time as indicated for disinfecting solutions.  Subjective:     Patient ID: Ruth Mckinney , female    DOB: 05/23/1976 , 44 y.o.   MRN: [id number]   Chief Complaint  Patient presents with  . ozempic and tresiba teaching    HPI  Here today for ozempic and tresiba teaching and discussion with her recent HgbA1c being significantly elevated    Past Medical History:  Diagnosis Date  . Abnormal Pap smear   . Anemia   . Genital herpes      Family History  Problem Relation Age of Onset  . Hypertension Maternal Grandmother   . Diabetes Maternal Grandmother   . Heart disease Maternal Grandfather   . Diabetes Maternal Grandfather   . Hypertension Mother   . Diabetes Mother   . Anemia Mother   . Migraines Mother   . Anesthesia problems Neg Hx      Current Outpatient Medications:  .  Blood Glucose Monitoring Suppl DEVI, Check blood sugars twice daily, Disp: 1 kit, Rfl: 0 .  medroxyPROGESTERone (DEPO-PROVERA) 150 MG/ML injection, Inject 150 mg into the muscle every 3 (three) months. Reported on 11/06/2015, Disp: , Rfl:  .  metFORMIN (GLUCOPHAGE) 500 MG tablet, Take 1 tablet (500 mg total) by mouth 2 (two) times daily with a meal. (Patient not taking: Reported on 06/22/2019), Disp: 60 tablet, Rfl: 2 .  valACYclovir (VALTREX) 500 MG tablet, Take 500 mg by mouth 2 (two) times daily. For 7 days as needed, Disp: , Rfl:  .  Blood Glucose Monitoring Suppl (CONTOUR MONITOR) w/Device KIT, 1 Stick by Does not apply route 2 (two) times daily., Disp: 1 kit, Rfl: 1 .  glucose blood (ONETOUCH VERIO) test strip, Use as instructed, Disp: 100 each, Rfl: 12 .  insulin degludec (TRESIBA FLEXTOUCH) 100 UNIT/ML SOPN FlexTouch Pen, Inject 0.1 mLs (10 Units  total) into the skin at bedtime., Disp: 5 pen, Rfl: 0 .  Lancets (ONETOUCH ULTRASOFT) lancets, Use as instructed, Disp: 100 each, Rfl: 12 .  OneTouch Delica Lancets 68T MISC, 1 Stick by Does not apply route 2 (two) times daily at 10 AM and 5 PM., Disp: 100 each, Rfl: 5 .  OZEMPIC, 1 MG/DOSE, 2 MG/1.5ML SOPN, INJECT 1 ML INTO THE SKIN ONCE A WEEK, Disp: 4 mL, Rfl: 0 .  Vitamin D, Ergocalciferol, (DRISDOL) 1.25 MG (50000 UT) CAPS capsule, Take 1 capsule (50,000 Units total) by mouth 2 (two) times a week., Disp: 24 capsule, Rfl: 0   Allergies  Allergen Reactions  . Ibuprofen Nausea Only    Abdominal pains.     Review of Systems  Respiratory: Negative.   Endocrine: Positive for polyuria. Negative for polydipsia and polyphagia.  Hematological: Negative.   Psychiatric/Behavioral: Negative.      Today's Vitals   04/27/19 1444  BP: 130/78  Pulse: 99  Temp: 98 F (36.7 C)  TempSrc: Oral  Weight: 218 lb (98.9 kg)  Height: _0  (1.727 m)  PainSc: 0-No pain   Body mass index is 33.15 kg/m.   Objective:  Physical Exam Constitutional:      General: She is not in acute distress.    Appearance: Normal appearance.  Cardiovascular:     Rate and Rhythm: Normal rate.  Pulmonary:     Effort: Pulmonary  effort is normal. No respiratory distress.  Neurological:     General: No focal deficit present.     Mental Status: She is alert.  Psychiatric:        Mood and Affect: Mood normal.        Behavior: Behavior normal.        Thought Content: Thought content normal.        Judgment: Judgment normal.         Assessment And Plan:     1. Type 2 diabetes mellitus without complication, without long-term current use of insulin ([place])  Newly diagnosed - education on ozempic and metformin  Discussed risks of diabetes on the kidneys, eyes and heart.   Educated on how to inject Ozempic and side effects to include nausea, difficulty swallowing or sudden onset stomach pain.    Minette Brine,  FNP    THE PATIENT IS ENCOURAGED TO PRACTICE SOCIAL DISTANCING DUE TO THE COVID-19 PANDEMIC.  She was here today for Ozempic teaching was a nurse visit was educated on how to inject Ozempic. Questions answered

## 2019-10-19 ENCOUNTER — Other Ambulatory Visit: Payer: Self-pay | Admitting: Nurse Practitioner

## 2019-10-19 DIAGNOSIS — E119 Type 2 diabetes mellitus without complications: Secondary | ICD-10-CM

## 2020-02-08 ENCOUNTER — Ambulatory Visit: Payer: BC Managed Care – PPO | Admitting: Internal Medicine

## 2020-02-08 ENCOUNTER — Encounter: Payer: Self-pay | Admitting: Internal Medicine

## 2020-02-08 ENCOUNTER — Other Ambulatory Visit: Payer: Self-pay

## 2020-02-08 VITALS — BP 118/78 | HR 87 | Ht 67.8 in | Wt 222.0 lb

## 2020-02-08 DIAGNOSIS — Z794 Long term (current) use of insulin: Secondary | ICD-10-CM

## 2020-02-08 DIAGNOSIS — E1165 Type 2 diabetes mellitus with hyperglycemia: Secondary | ICD-10-CM

## 2020-02-08 LAB — POCT GLYCOSYLATED HEMOGLOBIN (HGB A1C): Hemoglobin A1C: 12.6 % — AB (ref 4.0–5.6)

## 2020-02-08 MED ORDER — TRESIBA FLEXTOUCH 200 UNIT/ML ~~LOC~~ SOPN: 16.0000 [IU] | PEN_INJECTOR | Freq: Every day | SUBCUTANEOUS | 3 refills | Status: DC

## 2020-02-08 MED ORDER — ACCU-CHEK GUIDE VI STRP: ORAL_STRIP | 12 refills | Status: DC

## 2020-02-08 MED ORDER — OZEMPIC (1 MG/DOSE) 2 MG/1.5ML ~~LOC~~ SOPN: 1.0000 mg | PEN_INJECTOR | SUBCUTANEOUS | 5 refills | Status: DC

## 2020-02-08 MED ORDER — INSULIN PEN NEEDLE 32G X 4 MM MISC: 3 refills | Status: DC

## 2020-02-08 MED ORDER — ACCU-CHEK GUIDE ME W/DEVICE KIT: PACK | 0 refills | Status: DC

## 2020-02-08 MED ORDER — ACCU-CHEK MULTICLIX LANCETS MISC: 12 refills | Status: DC

## 2020-02-08 NOTE — Addendum Note (Signed)
Addended by: Darliss Ridgel I on: 02/08/2020 01:25 PM   Modules accepted: Orders

## 2020-02-08 NOTE — Progress Notes (Signed)
Patient ID: Ruth Mckinney, female   DOB: [birth year], 44 y.o.   MRN: [id number]   This visit occurred during the SARS-CoV-2 public health emergency.  Safety protocols were in place, including screening questions prior to the visit, additional usage of staff PPE, and extensive cleaning of exam room while observing appropriate contact time as indicated for disinfecting solutions.   HPI: Ruth Mckinney is a 44 y.o.-year-old female, self-referred, for management of DM2, dx in 04/2019, insulin-dependent (but now off insulin), uncontrolled, without long-term complications.  Her mother, Fatima Sanger, is also my patient.  Reviewed HbA1c levels: Lab Results  Component Value Date   HGBA1C 9.6 (H) 06/22/2019   HGBA1C 12.2 (H) 04/25/2019   Pt is currently not on any medications for her diabetes.  She was previously on Metformin, Ozempic, and Tresiba, all started at dx, but not taking any of these now.  She had severe diarrhea with Metformin. She did well on insulin and GLP1 R agonist but could not obtain refills in 10/2019.  Pt checks her sugars seldom - thinks her meter is malfxning: - am: n/c - 2h after b'fast: n/c - before lunch: 236 - 2h after lunch: n/c - before dinner: n/c - 2h after dinner: n/c - bedtime: n/c - nighttime: n/c Lowest sugar was 215; ? hypoglycemia awareness.  Highest sugar was 390s.  Glucometer: One Touch verio  Pt's meals are: - Breakfast: yoghurt, hash browns, biscuits - Lunch: varies - Dinner: varies - Snacks: 2-4  - no CKD, last BUN/creatinine:  Lab Results  Component Value Date   BUN 11 06/22/2019   BUN 8 04/25/2019   CREATININE 0.85 06/22/2019   CREATININE 0.85 04/25/2019   - + HL; last set of lipids: 02/08/2013: 155/77/38/102 No results found for: CHOL, HDL, LDLCALC, LDLDIRECT, TRIG, CHOLHDL  - last eye exam was in 2020. No DR.   - no numbness and tingling in her feet.  Pt has FH of DM in [place] and both maternal grandparents.  She is on  Depo-Provera.  She has no FH of MTC. No FH of pancreatitis.  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  ROS: Constitutional: + Weight gain, no weight loss, + increased appetite, + fatigue, + hot flashes, + increased urination and nocturia, + poor sleep Eyes: no blurry vision, no xerophthalmia ENT: no sore throat, no nodules palpated in neck, no dysphagia, no odynophagia, no hoarseness, no tinnitus, no hypoacusis Cardiovascular: no CP, no SOB, no palpitations, no leg swelling Respiratory: no cough, no SOB, no wheezing Gastrointestinal: no N, no V, + D, no C, + acid reflux Musculoskeletal: no muscle, no joint aches Skin: no rash, + hair loss Neurological: no tremors, no numbness or tingling/no dizziness/no HAs Psychiatric: no depression, no anxiety  Past Medical History:  Diagnosis Date  . Abnormal Pap smear   . Anemia   . Genital herpes    Past Surgical History:  Procedure Laterality Date  . ADENOIDECTOMY    . CESAREAN SECTION    . COLPOSCOPY    . LEEP    . MOUTH SURGERY     Social History   Socioeconomic History  . Marital status: Single    Spouse name: Not on file  . Number of children: 2  . Years of education: Not on file  . Highest education level: Not on file  Occupational History  . Occupation: Coding and billing in [place]  Tobacco Use  . Smoking status: Former Smoker  Packs/day: 1.00    Quit date: 01/04/2019    Years since quitting: 1.0  . Smokeless tobacco: Never Used  Substance and Sexual Activity  . Alcohol use: Yes    Comment: socially   . Drug use: Never  . Sexual activity: Yes    Birth control/protection: Pill, Injection    Comment: DEPO PROVERA  Other Topics Concern  . Not on file  Social History Narrative  . Not on file   Social Determinants of Health   Financial Resource Strain:   . Difficulty of Paying Living Expenses:   Food Insecurity:   . Worried About Programme researcher, broadcasting/film/video in the  Last Year:   . Barista in the Last Year:   Transportation Needs:   . Freight forwarder (Medical):   Marland Kitchen Lack of Transportation (Non-Medical):   Physical Activity:   . Days of Exercise per Week:   . Minutes of Exercise per Session:   Stress:   . Feeling of Stress :   Social Connections:   . Frequency of Communication with Friends and Family:   . Frequency of Social Gatherings with Friends and Family:   . Attends Religious Services:   . Active Member of Clubs or Organizations:   . Attends Banker Meetings:   Marland Kitchen Marital Status:   Intimate Partner Violence:   . Fear of Current or Ex-Partner:   . Emotionally Abused:   Marland Kitchen Physically Abused:   . Sexually Abused:    Current Outpatient Medications on File Prior to Visit  Medication Sig Dispense Refill  . medroxyPROGESTERone (DEPO-PROVERA) 150 MG/ML injection Inject 150 mg into the muscle every 3 (three) months. Reported on 11/06/2015    . valACYclovir (VALTREX) 500 MG tablet Take 500 mg by mouth 2 (two) times daily. For 7 days as needed     No current facility-administered medications on file prior to visit.   Allergies  Allergen Reactions  . Ibuprofen Nausea Only    Abdominal pains.   Family History  Problem Relation Age of Onset  . Hypertension Maternal Grandmother   . Diabetes Maternal Grandmother   . Heart disease Maternal Grandfather   . Diabetes Maternal Grandfather   . Hypertension Mother   . Diabetes Mother   . Anemia Mother   . Migraines Mother   . Anesthesia problems Neg Hx   Also, mother with thyroid cancer.  PE: BP 118/78   Pulse 87   Ht 5' 7.8" (1.722 m)   Wt 222 lb (100.7 kg)   SpO2 97%   BMI 33.95 kg/m  Wt Readings from Last 3 Encounters:  02/08/20 222 lb (100.7 kg)  06/22/19 223 lb 9.6 oz (101.4 kg)  04/27/19 218 lb (98.9 kg)   Constitutional: overweight, in NAD Eyes: PERRLA, EOMI, no exophthalmos ENT: moist mucous membranes, no thyromegaly, no cervical  lymphadenopathy Cardiovascular: RRR, No MRG Respiratory: CTA B Gastrointestinal: abdomen soft, NT, ND, BS+ Musculoskeletal: no deformities, strength intact in all 4 Skin: moist, warm, no rashes Neurological: no tremor with outstretched hands, DTR normal in all 4  ASSESSMENT: 1. DM2, insulin-dependent, uncontrolled, without long-term complications, but with hyperglycemia -However, off insulin now as she could not refill her prescription  PLAN:  1. Patient with relatively recent uncontrolled DM, with an HbA1c today of 12.6% (higher). -Patient was diagnosed with diabetes in 04/2019.  She was started on Metformin, which, however, she could not tolerate due to diarrhea.  She was also started on [place] and Ozempic at that  time.  She tolerated these well and started to feel better on them.  HbA1c also improved.  However, in 10/2019, she went to refill her prescription but could not do so.  She is now 3 months off any diabetic medications.  She is feeling really tired.  She does not check sugars consistently as she feels her meter is malfunctioning.  At today's visit, I advised her to check sugars twice a day and send a prescription for an Accu-Chek guide meter to her pharmacy. -We also discussed about improving her diet and I made specific suggestions.  She also accepts a referral to nutrition today.  Also given written instructions about improving diet. -We also discussed about starting back on insulin Evaristo Bury, since this is covered for her), and also Ozempic.  She tolerated these well in the past and she also felt better while on them.  We will start at a low dose and increase the dose of both gradually. -At next visit, I plan to check her insulin production.  This could not be checked today is her sugars are very high now. -I sent prescriptions for these and for pen needles to her pharmacy. - I suggested to:  Patient Instructions  Please start: - Ozempic 0.5 mg weekly x 4 weeks, then increase to  1 mg weekly - Tresiba 16 units daily and increase by 4 units every 4 days until sugars in am are <130 or you reach 36 units.  Please schedule an appt with Oran Rein with nutrition.  Please let me know if the sugars are consistently <80 or >200.  Please come back for a follow-up appointment in 3 months.  - Strongly advised her to start checking sugars at different times of the day - check 2x a day, rotating checks - discussed about CBG targets for treatment: 80-130 mg/dL before meals and <035 mg/dL after meals; target WSF6C <7%. - given sugar log and advised how to fill it and to bring it at next appt  - given foot care handout and explained the principles  - given instructions for hypoglycemia management "15-15 rule"  - advised for yearly eye exams  - Return to clinic in 3 mo with sugar log   Carlus Pavlov, MD PhD [facility] Endocrinology

## 2020-02-08 NOTE — Patient Instructions (Addendum)
Please start: - Ozempic 0.5 mg weekly x 4 weeks, then increase to 1 mg weekly - Tresiba 16 units daily and increase by 4 units every 4 days until sugars in am are <130 or you reach 36 units.  Please schedule an appt with Oran Rein with nutrition.  Please let me know if the sugars are consistently <80 or >200.  Please come back for a follow-up appointment in 3 months.  PATIENT INSTRUCTIONS FOR TYPE 2 DIABETES:  DIET AND EXERCISE Diet and exercise is an important part of diabetic treatment.  We recommended aerobic exercise in the form of brisk walking (working between 40-60% of maximal aerobic capacity, similar to brisk walking) for 150 minutes per week (such as 30 minutes five days per week) along with 3 times per week performing 'resistance' training (using various gauge rubber tubes with handles) 5-10 exercises involving the major muscle groups (upper body, lower body and core) performing 10-15 repetitions (or near fatigue) each exercise. Start at half the above goal but build slowly to reach the above goals. If limited by weight, joint pain, or disability, we recommend daily walking in a swimming pool with water up to waist to reduce pressure from joints while allow for adequate exercise.    BLOOD GLUCOSES Monitoring your blood glucoses is important for continued management of your diabetes. Please check your blood glucoses 2-4 times a day: fasting, before meals and at bedtime (you can rotate these measurements - e.g. one day check before the 3 meals, the next day check before 2 of the meals and before bedtime, etc.).   HYPOGLYCEMIA (low blood sugar) Hypoglycemia is usually a reaction to not eating, exercising, or taking too much insulin/ other diabetes drugs.  Symptoms include tremors, sweating, hunger, confusion, headache, etc. Treat IMMEDIATELY with 15 grams of Carbs:  4 glucose tablets   cup regular juice/soda  2 tablespoons raisins  4 teaspoons sugar  1 tablespoon  honey Recheck blood glucose in 15 mins and repeat above if still symptomatic/blood glucose <100.  RECOMMENDATIONS TO REDUCE YOUR RISK OF DIABETIC COMPLICATIONS: * Take your prescribed MEDICATION(S) * Follow a DIABETIC diet: Complex carbs, fiber rich foods, (monounsaturated and polyunsaturated) fats * AVOID saturated/trans fats, high fat foods, >2,300 mg salt per day. * EXERCISE at least 5 times a week for 30 minutes or preferably daily.  * DO NOT SMOKE OR DRINK more than 1 drink a day. * Check your FEET every day. Do not wear tightfitting shoes. Contact us if you develop an ulcer * See your EYE doctor once a year or more if needed * Get a FLU shot once a year * Get a PNEUMONIA vaccine once before and once after age 78 years  GOALS:  * Your Hemoglobin A1c of <7%  * fasting sugars need to be <130 * after meals sugars need to be <180 (2h after you start eating) * Your Systolic BP should be 140 or lower  * Your Diastolic BP should be 80 or lower  * Your HDL (Good Cholesterol) should be 40 or higher  * Your LDL (Bad Cholesterol) should be 100 or lower. * Your Triglycerides should be 150 or lower  * Your Urine microalbumin (kidney function) should be <30 * Your Body Mass Index should be 25 or lower    Please consider the following ways to cut down carbs and fat and increase fiber and micronutrients in your diet: - substitute whole grain for white bread or pasta - substitute brown rice for white  rice - substitute 90-calorie flat bread pieces for slices of bread when possible - substitute sweet potatoes or yams for white potatoes - substitute humus for margarine - substitute tofu for cheese when possible - substitute almond or rice milk for regular milk (would not drink soy milk daily due to concern for soy estrogen influence on breast cancer risk) - substitute dark chocolate for other sweets when possible - substitute water - can add lemon or orange slices for taste - for diet sodas  (artificial sweeteners will trick your body that you can eat sweets without getting calories and will lead you to overeating and weight gain in the long run) - do not skip breakfast or other meals (this will slow down the metabolism and will result in more weight gain over time)  - can try smoothies made from fruit and almond/rice milk in am instead of regular breakfast - can also try old-fashioned (not instant) oatmeal made with almond/rice milk in am - order the dressing on the side when eating salad at a restaurant (pour less than half of the dressing on the salad) - eat as little meat as possible - can try juicing, but should not forget that juicing will get rid of the fiber, so would alternate with eating raw veg./fruits or drinking smoothies - use as little oil as possible, even when using olive oil - can dress a salad with a mix of balsamic vinegar and lemon juice, for e.g. - use agave nectar, stevia sugar, or regular sugar rather than artificial sweateners - steam or broil/roast veggies  - snack on veggies/fruit/nuts (unsalted, preferably) when possible, rather than processed foods - reduce or eliminate aspartame in diet (it is in diet sodas, chewing gum, etc) Read the labels!  Try to read Dr. Katherina Right book: "Program for Reversing Diabetes" for other ideas for healthy eating.

## 2020-02-13 ENCOUNTER — Other Ambulatory Visit: Payer: Self-pay

## 2020-02-13 MED ORDER — ACCU-CHEK SOFTCLIX LANCETS MISC: 12 refills | Status: DC

## 2020-02-14 ENCOUNTER — Other Ambulatory Visit: Payer: Self-pay

## 2020-02-14 MED ORDER — OZEMPIC (1 MG/DOSE) 4 MG/3ML ~~LOC~~ SOPN: 1.0000 mg | PEN_INJECTOR | SUBCUTANEOUS | 2 refills | Status: DC

## 2020-03-07 ENCOUNTER — Other Ambulatory Visit: Payer: Self-pay

## 2020-03-07 MED ORDER — BAYER CONTOUR LINK 2.4 W/DEVICE KIT: PACK | 0 refills | Status: DC

## 2020-03-07 MED ORDER — MICROLET LANCETS MISC: 2 refills | Status: DC

## 2020-03-07 MED ORDER — GLUCOSE BLOOD VI STRP: ORAL_STRIP | 2 refills | Status: DC

## 2020-05-17 ENCOUNTER — Ambulatory Visit: Payer: BC Managed Care – PPO | Admitting: Internal Medicine

## 2020-05-17 ENCOUNTER — Encounter: Payer: Self-pay | Admitting: Internal Medicine

## 2020-05-17 ENCOUNTER — Other Ambulatory Visit: Payer: Self-pay

## 2020-05-17 VITALS — BP 138/72 | HR 103 | Ht 67.8 in | Wt 225.8 lb

## 2020-05-17 DIAGNOSIS — R928 Other abnormal and inconclusive findings on diagnostic imaging of breast: Secondary | ICD-10-CM | POA: Diagnosis not present

## 2020-05-17 DIAGNOSIS — E1165 Type 2 diabetes mellitus with hyperglycemia: Secondary | ICD-10-CM

## 2020-05-17 DIAGNOSIS — Z01419 Encounter for gynecological examination (general) (routine) without abnormal findings: Secondary | ICD-10-CM | POA: Diagnosis not present

## 2020-05-17 DIAGNOSIS — Z6835 Body mass index (BMI) 35.0-35.9, adult: Secondary | ICD-10-CM | POA: Diagnosis not present

## 2020-05-17 DIAGNOSIS — Z794 Long term (current) use of insulin: Secondary | ICD-10-CM

## 2020-05-17 DIAGNOSIS — E785 Hyperlipidemia, unspecified: Secondary | ICD-10-CM | POA: Diagnosis not present

## 2020-05-17 DIAGNOSIS — E669 Obesity, unspecified: Secondary | ICD-10-CM

## 2020-05-17 DIAGNOSIS — Z309 Encounter for contraceptive management, unspecified: Secondary | ICD-10-CM | POA: Diagnosis not present

## 2020-05-17 LAB — COMPREHENSIVE METABOLIC PANEL
ALT: 20 U/L (ref 0–35)
AST: 11 U/L (ref 0–37)
Albumin: 4.3 g/dL (ref 3.5–5.2)
Alkaline Phosphatase: 94 U/L (ref 39–117)
BUN: 10 mg/dL (ref 6–23)
CO2: 25 mEq/L (ref 19–32)
Calcium: 10.4 mg/dL (ref 8.4–10.5)
Chloride: 105 mEq/L (ref 96–112)
Creatinine, Ser: 0.85 mg/dL (ref 0.40–1.20)
GFR: 82.35 mL/min (ref >60.00)
Glucose, Bld: 214 mg/dL — ABNORMAL HIGH (ref 70–99)
Potassium: 4.2 mEq/L (ref 3.5–5.1)
Sodium: 137 mEq/L (ref 135–145)
Total Bilirubin: 0.3 mg/dL (ref 0.2–1.2)
Total Protein: 7 g/dL (ref 6.0–8.3)

## 2020-05-17 LAB — LIPID PANEL
Cholesterol: 153 mg/dL (ref 0–200)
HDL: 44.3 mg/dL (ref >39.00)
LDL Cholesterol: 90 mg/dL (ref 0–99)
NonHDL: 108.73
Total CHOL/HDL Ratio: 3
Triglycerides: 96 mg/dL (ref 0.0–149.0)
VLDL: 19.2 mg/dL (ref 0.0–40.0)

## 2020-05-17 LAB — POCT GLYCOSYLATED HEMOGLOBIN (HGB A1C): Hemoglobin A1C: 8.8 % — AB (ref 4.0–5.6)

## 2020-05-17 LAB — MICROALBUMIN / CREATININE URINE RATIO
Creatinine,U: 131.3 mg/dL
Microalb Creat Ratio: 2.7 mg/g (ref 0.0–30.0)
Microalb, Ur: 3.6 mg/dL — ABNORMAL HIGH (ref 0.0–1.9)

## 2020-05-17 LAB — HM PAP SMEAR

## 2020-05-17 MED ORDER — TRESIBA FLEXTOUCH 200 UNIT/ML ~~LOC~~ SOPN: 36.0000 [IU] | PEN_INJECTOR | Freq: Every day | SUBCUTANEOUS | 3 refills | Status: AC

## 2020-05-17 MED ORDER — OZEMPIC (1 MG/DOSE) 4 MG/3ML ~~LOC~~ SOPN: 1.0000 mg | PEN_INJECTOR | SUBCUTANEOUS | 2 refills | Status: AC

## 2020-05-17 NOTE — Progress Notes (Signed)
Patient ID: Ruth Mckinney, female   DOB: 30-Dec-1975, 44 y.o.   MRN: [id number]   This visit occurred during the SARS-CoV-2 public health emergency.  Safety protocols were in place, including screening questions prior to the visit, additional usage of staff PPE, and extensive cleaning of exam room while observing appropriate contact time as indicated for disinfecting solutions.   HPI: Ruth Mckinney is a 44 y.o.-year-old female, initially self-referred, returning for follow-up for DM2, dx in 04/2019, insulin-dependent, uncontrolled, without long-term complications.  Her mother, Larinda Buttery, is also my patient.  Last visit 3.5 months ago.  Reviewed HbA1c levels: Lab Results  Component Value Date   HGBA1C 12.6 (A) 02/08/2020   HGBA1C 9.6 (H) 06/22/2019   HGBA1C 12.2 (H) 04/25/2019   At last visit, she was not on any medications for her diabetes.  Currently on (started 02/2020): - Ozempic 1 mg weekly (was off for 2 weeks - just restarted) - Tresiba 16 >> 36 units daily  She was previously on Metformin, Ozempic, and Tresiba, all started at dx, but she was off of these at last visit She had severe diarrhea with Metformin. She did well on insulin and GLP1 R agonist but could not obtain refills in 10/2019.  She was not checking sugars at last visit as her meter was defective.  She is not checking once a day - in last month: - am: n/c >> 116-144, 170, 180 - 2h after b'fast: n/c - before lunch: 236 >> n/c - 2h after lunch: n/c - before dinner: n/c - 2h after dinner: n/c - bedtime: n/c - nighttime: n/c Lowest sugar was 215 >> 116; ? hypoglycemia awareness.  Highest sugar was 390s >> 400s soon after last OV.  Glucometer: One Touch verio >> True Metrix  Pt's meals are: - Breakfast: yoghurt, hash browns, biscuits - Lunch: varies - Dinner: varies - Snacks: 2-4  -No CKD, last BUN/creatinine:  Lab Results  Component Value Date   BUN 11 06/22/2019   BUN 8 04/25/2019    CREATININE 0.85 06/22/2019   CREATININE 0.85 04/25/2019   -+ HL; last set of lipids: 02/08/2013: 155/77/38/102 No results found for: CHOL, HDL, LDLCALC, LDLDIRECT, TRIG, CHOLHDL  - last eye exam was in 2020: No DR  - no numbness and tingling in her feet.  Pt has FH of DM in [place] and both maternal grandparents.  She continues on Depo-Provera.  She has no FH of MTC. No FH of pancreatitis.  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  ROS: Constitutional: no weight gain/no weight loss, resolved fatigue, no subjective hyperthermia, no subjective hypothermia Eyes: no blurry vision, no xerophthalmia ENT: no sore throat, no nodules palpated in neck, no dysphagia, no odynophagia, no hoarseness Cardiovascular: no CP/no SOB/no palpitations/no leg swelling Respiratory: no cough/no SOB/no wheezing Gastrointestinal: no N/no V/no D/no C/no acid reflux Musculoskeletal: no muscle aches/no joint aches Skin: no rashes, no hair loss Neurological: no tremors/no numbness/no tingling/no dizziness  I reviewed pt's medications, allergies, PMH, social hx, family hx, and changes were documented in the history of present illness. Otherwise, unchanged from my initial visit note.  Past Medical History:  Diagnosis Date  . Abnormal Pap smear   . Anemia   . Genital herpes    Past Surgical History:  Procedure Laterality Date  . ADENOIDECTOMY    . CESAREAN SECTION    . COLPOSCOPY    . LEEP    . MOUTH SURGERY     Social  History   Socioeconomic History  . Marital status: Single    Spouse name: Not on file  . Number of children: 2  . Years of education: Not on file  . Highest education level: Not on file  Occupational History  . Occupation: Coding and billing in [place]  Tobacco Use  . Smoking status: Former Smoker    Packs/day: 1.00    Quit date: 01/04/2019    Years since quitting: 1.3  . Smokeless tobacco: Never Used  Substance and Sexual  Activity  . Alcohol use: Yes    Comment: socially   . Drug use: Never  . Sexual activity: Yes    Birth control/protection: Pill, Injection    Comment: DEPO PROVERA  Other Topics Concern  . Not on file  Social History Narrative  . Not on file   Social Determinants of Health   Financial Resource Strain:   . Difficulty of Paying Living Expenses: Not on file  Food Insecurity:   . Worried About Charity fundraiser in the Last Year: Not on file  . Ran Out of Food in the Last Year: Not on file  Transportation Needs:   . Lack of Transportation (Medical): Not on file  . Lack of Transportation (Non-Medical): Not on file  Physical Activity:   . Days of Exercise per Week: Not on file  . Minutes of Exercise per Session: Not on file  Stress:   . Feeling of Stress : Not on file  Social Connections:   . Frequency of Communication with Friends and Family: Not on file  . Frequency of Social Gatherings with Friends and Family: Not on file  . Attends Religious Services: Not on file  . Active Member of Clubs or Organizations: Not on file  . Attends Archivist Meetings: Not on file  . Marital Status: Not on file  Intimate Partner Violence:   . Fear of Current or Ex-Partner: Not on file  . Emotionally Abused: Not on file  . Physically Abused: Not on file  . Sexually Abused: Not on file   Current Outpatient Medications on File Prior to Visit  Medication Sig Dispense Refill  . Blood Glucose Monitoring Suppl (BAYER CONTOUR LINK 2.4) w/Device KIT Use Bayer Contour link meter to check blood sugar 2 times a day 1 kit 0  . glucose blood test strip Use Bayer Contour test strips to check blood sugar 2 times a day 200 each 2  . insulin degludec (TRESIBA FLEXTOUCH) 200 UNIT/ML FlexTouch Pen Inject 16 Units into the skin daily. 6 pen 3  . Insulin Pen Needle 32G X 4 MM MISC Use 1x a day 100 each 3  . medroxyPROGESTERone (DEPO-PROVERA) 150 MG/ML injection Inject 150 mg into the muscle every 3  (three) months. Reported on 11/06/2015    . Microlet Lancets MISC Use to check blood sugar 2 times a day 200 each 2  . Semaglutide, 1 MG/DOSE, (OZEMPIC, 1 MG/DOSE,) 4 MG/3ML SOPN Inject 0.75 mLs (1 mg total) into the skin once a week. 9 mL 2  . valACYclovir (VALTREX) 500 MG tablet Take 500 mg by mouth 2 (two) times daily. For 7 days as needed     No current facility-administered medications on file prior to visit.   Allergies  Allergen Reactions  . Ibuprofen Nausea Only    Abdominal pains.   Family History  Problem Relation Age of Onset  . Hypertension Maternal Grandmother   . Diabetes Maternal Grandmother   . Heart  disease Maternal Grandfather   . Diabetes Maternal Grandfather   . Hypertension Mother   . Diabetes Mother   . Anemia Mother   . Migraines Mother   . Anesthesia problems Neg Hx   Also, mother with thyroid cancer.  PE: BP 138/72   Pulse (!) 103   Ht 5' 7.8" (1.722 m)   Wt 225 lb 12.8 oz (102.4 kg)   SpO2 97%   BMI 34.54 kg/m  Wt Readings from Last 3 Encounters:  05/17/20 225 lb 12.8 oz (102.4 kg)  02/08/20 222 lb (100.7 kg)  06/22/19 223 lb 9.6 oz (101.4 kg)   Constitutional: overweight, in NAD Eyes: PERRLA, EOMI, no exophthalmos ENT: moist mucous membranes, no thyromegaly, no cervical lymphadenopathy Cardiovascular: tachycardia, RR, No MRG Respiratory: CTA B Gastrointestinal: abdomen soft, NT, ND, BS+ Musculoskeletal: no deformities, strength intact in all 4 Skin: moist, warm, no rashes Neurological: no tremor with outstretched hands, DTR normal in all 4  ASSESSMENT: 1. DM2, insulin-dependent, uncontrolled, without long-term complications, but with hyperglycemia  2.  Hyperlipidemia  3.  Obesity class I  PLAN:  1. Patient with very uncontrolled diabetes, off all of her diabetic medicines for 3 months at last visit, with an HbA1c of 12.6%.  At that time, she was also not checking sugars and I strongly advised her to start.  I sent a prescription for an  Accu-Chek guide meter to her pharmacy.  We also started [place] at a lower dose and also Ozempic at 0.5 mg weekly and advised her to advance to 1 mg weekly. -We discussed at last visit about checking her insulin production but we could not do it at last visit due to the very high blood sugars.  I also referred her to nutrition at last visit.  She did not have this appointment yet. -At this visit, we reviewed together her sugar log.  In the last month, she only checked in the morning.  Sugars started to improve after our last visit and they are almost all at goal in the last month, with only few mild hyperglycemic exceptions.  However, she is not checking sugars later in the day (she was out of strips) but I advised her to restart. - we checked her HbA1c: 8.8% (MUCH better) - based on the improvement in the HbA1c, and the very good blood sugars in the morning, for now, we will continue with the current regimen -She feels much better now and plans to start exercising (walking along with her mom) - I suggested to:  Patient Instructions  Please continue: - Ozempic 1 mg weekly - Tresiba 36 units daily   Please come back for a follow-up appointment in 3 months.  - advised to check sugars at different times of the day - 1-2x a day, rotating check times - advised for yearly eye exams >> she is UTD - return to clinic in 3 months  2. HL -Reviewed latest lipid panel from 2014: LDL slightly high, HDL slightly low -She is not on a statin -We will repeat her lipid panel today (nonfasting - had cheese, coffee)  3.  Obesity class I -Continue Ozempic 1 mg weekly, which should also help with weight loss -gained 3 lbs since last visit, but probably due to improved glucotoxicity   Component     Latest Ref Rng & Units 05/17/2020  Sodium     135 - 145 mEq/L 137  Potassium     3.5 - 5.1 mEq/L 4.2  Chloride  96 - 112 mEq/L 105  CO2     19 - 32 mEq/L 25  Glucose     70 - 99 mg/dL 214 (H)  BUN     6  - 23 mg/dL 10  Creatinine     0.40 - 1.20 mg/dL 0.85  Total Bilirubin     0.2 - 1.2 mg/dL 0.3  Alkaline Phosphatase     39 - 117 U/L 94  AST     0 - 37 U/L 11  ALT     0 - 35 U/L 20  Total Protein     6.0 - 8.3 g/dL 7.0  Albumin     3.5 - 5.2 g/dL 4.3  GFR     >60.00 mL/min 82.35  Calcium     8.4 - 10.5 mg/dL 10.4  Cholesterol     0 - 200 mg/dL 153  Triglycerides     0 - 149 mg/dL 96.0  HDL Cholesterol     >39.00 mg/dL 44.30  VLDL     0.0 - 40.0 mg/dL 19.2  LDL (calc)     0 - 99 mg/dL 90  Total CHOL/HDL Ratio      3  NonHDL      108.73  Microalb, Ur     0.0 - 1.9 mg/dL 3.6 (H)  Creatinine,U     mg/dL 131.3  MICROALB/CREAT RATIO     0.0 - 30.0 mg/g 2.7   Philemon Kingdom, MD PhD [facility] Endocrinology

## 2020-05-17 NOTE — Patient Instructions (Addendum)
Please continue: - Ozempic 1 mg weekly - Tresiba 36 units daily   Please come back for a follow-up appointment in 3 months.

## 2020-05-24 ENCOUNTER — Encounter: Payer: Self-pay | Admitting: Nurse Practitioner

## 2020-08-12 ENCOUNTER — Other Ambulatory Visit: Payer: BC Managed Care – PPO

## 2020-08-30 ENCOUNTER — Ambulatory Visit: Payer: BC Managed Care – PPO | Admitting: Internal Medicine

## 2021-03-06 DIAGNOSIS — M542 Cervicalgia: Secondary | ICD-10-CM | POA: Diagnosis not present

## 2021-05-30 DIAGNOSIS — Z01419 Encounter for gynecological examination (general) (routine) without abnormal findings: Secondary | ICD-10-CM | POA: Diagnosis not present

## 2021-05-30 DIAGNOSIS — Z124 Encounter for screening for malignant neoplasm of cervix: Secondary | ICD-10-CM | POA: Diagnosis not present

## 2021-05-30 DIAGNOSIS — Z3042 Encounter for surveillance of injectable contraceptive: Secondary | ICD-10-CM | POA: Diagnosis not present

## 2021-05-30 DIAGNOSIS — Z6831 Body mass index (BMI) 31.0-31.9, adult: Secondary | ICD-10-CM | POA: Diagnosis not present

## 2021-05-30 DIAGNOSIS — R87810 Cervical high risk human papillomavirus (HPV) DNA test positive: Secondary | ICD-10-CM | POA: Diagnosis not present

## 2021-08-22 DIAGNOSIS — Z8349 Family history of other endocrine, nutritional and metabolic diseases: Secondary | ICD-10-CM | POA: Diagnosis not present

## 2021-08-22 DIAGNOSIS — R5382 Chronic fatigue, unspecified: Secondary | ICD-10-CM | POA: Diagnosis not present

## 2021-08-22 DIAGNOSIS — E1165 Type 2 diabetes mellitus with hyperglycemia: Secondary | ICD-10-CM | POA: Diagnosis not present

## 2021-08-22 DIAGNOSIS — Z13220 Encounter for screening for lipoid disorders: Secondary | ICD-10-CM | POA: Diagnosis not present

## 2021-08-22 DIAGNOSIS — E7800 Pure hypercholesterolemia, unspecified: Secondary | ICD-10-CM | POA: Diagnosis not present

## 2021-08-22 DIAGNOSIS — Z0000 Encounter for general adult medical examination without abnormal findings: Secondary | ICD-10-CM | POA: Diagnosis not present

## 2021-09-26 ENCOUNTER — Other Ambulatory Visit: Payer: Self-pay

## 2021-09-26 ENCOUNTER — Emergency Department (HOSPITAL_COMMUNITY)
Admission: EM | Admit: 2021-09-26 | Discharge: 2021-09-27 | Disposition: A | Discharge status: Home / Self Care | Payer: BC Managed Care – PPO | Attending: Emergency Medicine | Admitting: Emergency Medicine

## 2021-09-26 ENCOUNTER — Encounter (HOSPITAL_COMMUNITY): Payer: Self-pay | Admitting: *Deleted

## 2021-09-26 ENCOUNTER — Emergency Department (HOSPITAL_COMMUNITY): Payer: BC Managed Care – PPO

## 2021-09-26 DIAGNOSIS — Z5321 Procedure and treatment not carried out due to patient leaving prior to being seen by health care provider: Secondary | ICD-10-CM | POA: Diagnosis not present

## 2021-09-26 DIAGNOSIS — M25521 Pain in right elbow: Secondary | ICD-10-CM | POA: Insufficient documentation

## 2021-09-26 DIAGNOSIS — Y92410 Unspecified street and highway as the place of occurrence of the external cause: Secondary | ICD-10-CM | POA: Diagnosis not present
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
## 2021-09-27 NOTE — ED Notes (Signed)
Pt told this NT that the wait was too long and she was going to call an uber and go home. Pt was seen leaving the ED and getting in a car.

## 2021-10-10 DIAGNOSIS — E1165 Type 2 diabetes mellitus with hyperglycemia: Secondary | ICD-10-CM | POA: Diagnosis not present

## 2023-08-16 ENCOUNTER — Other Ambulatory Visit: Payer: Self-pay | Admitting: Obstetrics and Gynecology

## 2023-08-16 DIAGNOSIS — Z1231 Encounter for screening mammogram for malignant neoplasm of breast: Secondary | ICD-10-CM
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
## 2024-02-02 ENCOUNTER — Ambulatory Visit

## 2024-02-03 ENCOUNTER — Ambulatory Visit
Admission: RE | Admit: 2024-02-03 | Discharge: 2024-02-03 | Disposition: A | Discharge status: Home / Self Care | Source: Ambulatory Visit | Attending: Obstetrics and Gynecology | Admitting: Obstetrics and Gynecology

## 2024-02-03 DIAGNOSIS — Z1231 Encounter for screening mammogram for malignant neoplasm of breast: Secondary | ICD-10-CM

## 2024-06-06 ENCOUNTER — Other Ambulatory Visit: Payer: Self-pay | Admitting: Internal Medicine

## 2024-06-06 ENCOUNTER — Encounter: Payer: Self-pay | Admitting: Internal Medicine

## 2024-06-06 DIAGNOSIS — D490 Neoplasm of unspecified behavior of digestive system: Secondary | ICD-10-CM

## 2024-06-06 DIAGNOSIS — C187 Malignant neoplasm of sigmoid colon: Secondary | ICD-10-CM | POA: Insufficient documentation

## 2024-06-07 ENCOUNTER — Encounter: Payer: Self-pay | Admitting: Hematology

## 2024-06-07 ENCOUNTER — Ambulatory Visit
Admission: RE | Admit: 2024-06-07 | Discharge: 2024-06-07 | Disposition: A | Discharge status: Home / Self Care | Source: Ambulatory Visit | Attending: Internal Medicine | Admitting: Internal Medicine

## 2024-06-07 ENCOUNTER — Inpatient Hospital Stay (HOSPITAL_BASED_OUTPATIENT_CLINIC_OR_DEPARTMENT_OTHER): Admitting: Hematology

## 2024-06-07 ENCOUNTER — Inpatient Hospital Stay: Attending: Hematology

## 2024-06-07 VITALS — BP 122/76 | HR 108 | Temp 97.4°F | Resp 19 | Ht 67.0 in | Wt 165.5 lb

## 2024-06-07 DIAGNOSIS — Z8249 Family history of ischemic heart disease and other diseases of the circulatory system: Secondary | ICD-10-CM | POA: Insufficient documentation

## 2024-06-07 DIAGNOSIS — Z79624 Long term (current) use of inhibitors of nucleotide synthesis: Secondary | ICD-10-CM

## 2024-06-07 DIAGNOSIS — K5900 Constipation, unspecified: Secondary | ICD-10-CM

## 2024-06-07 DIAGNOSIS — D500 Iron deficiency anemia secondary to blood loss (chronic): Secondary | ICD-10-CM

## 2024-06-07 DIAGNOSIS — Z793 Long term (current) use of hormonal contraceptives: Secondary | ICD-10-CM

## 2024-06-07 DIAGNOSIS — Z832 Family history of diseases of the blood and blood-forming organs and certain disorders involving the immune mechanism: Secondary | ICD-10-CM | POA: Insufficient documentation

## 2024-06-07 DIAGNOSIS — K5730 Diverticulosis of large intestine without perforation or abscess without bleeding: Secondary | ICD-10-CM

## 2024-06-07 DIAGNOSIS — E119 Type 2 diabetes mellitus without complications: Secondary | ICD-10-CM | POA: Diagnosis not present

## 2024-06-07 DIAGNOSIS — K8020 Calculus of gallbladder without cholecystitis without obstruction: Secondary | ICD-10-CM

## 2024-06-07 DIAGNOSIS — F17290 Nicotine dependence, other tobacco product, uncomplicated: Secondary | ICD-10-CM

## 2024-06-07 DIAGNOSIS — N281 Cyst of kidney, acquired: Secondary | ICD-10-CM | POA: Diagnosis not present

## 2024-06-07 DIAGNOSIS — R634 Abnormal weight loss: Secondary | ICD-10-CM

## 2024-06-07 DIAGNOSIS — Z833 Family history of diabetes mellitus: Secondary | ICD-10-CM | POA: Diagnosis not present

## 2024-06-07 DIAGNOSIS — C187 Malignant neoplasm of sigmoid colon: Secondary | ICD-10-CM | POA: Insufficient documentation

## 2024-06-07 DIAGNOSIS — Z800 Family history of malignant neoplasm of digestive organs: Secondary | ICD-10-CM | POA: Diagnosis not present

## 2024-06-07 DIAGNOSIS — Z886 Allergy status to analgesic agent status: Secondary | ICD-10-CM | POA: Insufficient documentation

## 2024-06-07 DIAGNOSIS — Z79899 Other long term (current) drug therapy: Secondary | ICD-10-CM | POA: Diagnosis not present

## 2024-06-07 DIAGNOSIS — K921 Melena: Secondary | ICD-10-CM | POA: Diagnosis not present

## 2024-06-07 DIAGNOSIS — K56699 Other intestinal obstruction unspecified as to partial versus complete obstruction: Secondary | ICD-10-CM

## 2024-06-07 DIAGNOSIS — Z9089 Acquired absence of other organs: Secondary | ICD-10-CM | POA: Diagnosis not present

## 2024-06-07 DIAGNOSIS — Z7985 Long-term (current) use of injectable non-insulin antidiabetic drugs: Secondary | ICD-10-CM | POA: Diagnosis not present

## 2024-06-07 DIAGNOSIS — D490 Neoplasm of unspecified behavior of digestive system: Secondary | ICD-10-CM

## 2024-06-07 DIAGNOSIS — Z808 Family history of malignant neoplasm of other organs or systems: Secondary | ICD-10-CM

## 2024-06-07 DIAGNOSIS — K769 Liver disease, unspecified: Secondary | ICD-10-CM | POA: Diagnosis not present

## 2024-06-07 LAB — CBC WITH DIFFERENTIAL/PLATELET
Abs Immature Granulocytes: 0.01 K/uL (ref 0.00–0.07)
Basophils Absolute: 0 K/uL (ref 0.0–0.1)
Basophils Relative: 0 %
Eosinophils Absolute: 0.1 K/uL (ref 0.0–0.5)
Eosinophils Relative: 3 %
HCT: 40.1 % (ref 36.0–46.0)
Hemoglobin: 13.2 g/dL (ref 12.0–15.0)
Immature Granulocytes: 0 %
Lymphocytes Relative: 29 %
Lymphs Abs: 1.6 K/uL (ref 0.7–4.0)
MCH: 30 pg (ref 26.0–34.0)
MCHC: 32.9 g/dL (ref 30.0–36.0)
MCV: 91.1 fL (ref 80.0–100.0)
Monocytes Absolute: 0.3 K/uL (ref 0.1–1.0)
Monocytes Relative: 6 %
Neutro Abs: 3.3 K/uL (ref 1.7–7.7)
Neutrophils Relative %: 62 %
Platelets: 225 K/uL (ref 150–400)
RBC: 4.4 MIL/uL (ref 3.87–5.11)
RDW: 13.7 % (ref 11.5–15.5)
WBC: 5.3 K/uL (ref 4.0–10.5)
nRBC: 0 % (ref 0.0–0.2)

## 2024-06-07 LAB — COMPREHENSIVE METABOLIC PANEL WITH GFR
ALT: 8 U/L (ref 0–44)
AST: 19 U/L (ref 15–41)
Albumin: 4.4 g/dL (ref 3.5–5.0)
Alkaline Phosphatase: 129 U/L — ABNORMAL HIGH (ref 38–126)
Anion gap: 11 (ref 5–15)
BUN: <5 mg/dL — ABNORMAL LOW (ref 6–20)
CO2: 24 mmol/L (ref 22–32)
Calcium: 11.4 mg/dL — ABNORMAL HIGH (ref 8.9–10.3)
Chloride: 105 mmol/L (ref 98–111)
Creatinine, Ser: 0.76 mg/dL (ref 0.44–1.00)
GFR, Estimated: >60 mL/min (ref >60)
Glucose, Bld: 119 mg/dL — ABNORMAL HIGH (ref 70–99)
Potassium: 4.3 mmol/L (ref 3.5–5.1)
Sodium: 140 mmol/L (ref 135–145)
Total Bilirubin: 0.4 mg/dL (ref 0.0–1.2)
Total Protein: 7.7 g/dL (ref 6.5–8.1)

## 2024-06-07 LAB — FERRITIN: Ferritin: 98 ng/mL (ref 11–307)

## 2024-06-07 MED ORDER — IOPAMIDOL (ISOVUE-300) INJECTION 61%
100.0000 mL | Freq: Once | INTRAVENOUS | Status: AC | PRN
  Administered 2024-06-07: 100 mL via INTRAVENOUS

## 2024-06-07 NOTE — Progress Notes (Signed)
 [facility]   [phone] [phone]   Clinic New Consult Note   Patient Care Team: Dwight Trula SQUIBB, MD as PCP - General (Internal Medicine) Ardis Evalene CROME, RN as Oncology Nurse Navigator 06/07/2024  CHIEF COMPLAINTS/PURPOSE OF CONSULTATION:  New retired sigmoid colon cancer  REFERRING PHYSICIAN: Dr. Jillene   Discussed the use of AI scribe software for clinical note transcription with the patient, who gave verbal consent to proceed.  History of Present Illness Ruth Mckinney is a 48 year old female with newly diagnosed colon cancer who presents for a new consult. She is accompanied by her brother and her mother.  She was referred by Dr. Jillene, her gastroenterologist, for further evaluation and management of colon cancer.  She has experienced gastrointestinal symptoms for the past year, with significant issues over the last four to five months, including severe constipation alternating with small, frequent bowel movements. Constipation is severely painful with cramps and pain upon sitting. Internal pain occurs during sexual activity, radiating to her rear. Bowel habits have fluctuated between diarrhea and constipation. She has observed blood in her stool, described as clots when wiping. A recent colonoscopy revealed a mass in the sigmoid colon.  She has experienced significant weight loss, from 229 pounds in June of the previous year to 167 pounds recently, totaling approximately 65 pounds. She attributes some weight loss to dental work and the use of Ozempic .  Her energy levels have improved recently with better diabetes control, as her A1c has decreased to 6.7. She is on Tresiba , Ozempic , and atorvastatin for diabetes and cholesterol management.  Family history includes colon cancer in her maternal great-grandfather and thyroid  cancer in her mother. She smoked for over 20 years, quitting in 2020, and drinks alcohol occasionally. No high blood  pressure, heart disease, kidney disease, or eye problems. No current limitations in daily activities or work.     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[place] [employer]  Tobacco Use   Smoking status: Former    Types: Cigarettes    Start date: 2020    Quit date: 1992    Years since quitting: 33.8   Smokeless tobacco: Never  Vaping Use   Vaping status: Every Day  Substance and Sexual Activity   Alcohol use: Yes    Comment: socially    Drug use: Never   Sexual activity: Yes    Birth control/protection: Pill, Injection    Comment: DEPO PROVERA   Other Topics Concern   Not on file  Social History Narrative   Not on file   Social Drivers of Health   Financial Resource Strain: Not on file  Food Insecurity: No Food Insecurity (06/07/2024)   Hunger Vital Sign    Worried About Running Out of Food in the Last Year: Never true  Ran Out of Food in the Last Year: Never true  Transportation Needs: No Transportation Needs (06/07/2024)   PRAPARE - Administrator, Civil Service (Medical): No    Lack of Transportation (Non-Medical): No  Physical Activity: Not on file  Stress: Not on file  Social Connections: Not on file  Intimate Partner Violence: Not At Risk (06/07/2024)   Humiliation, Afraid, Rape, and Kick questionnaire    Fear of Current or Ex-Partner: No    Emotionally Abused: No    Physically Abused: No    Sexually Abused: No     FAMILY HISTORY: Family History  Problem Relation Age of Onset   Hypertension Mother    Diabetes Mother    Anemia Mother    Migraines Mother    Hypertension Maternal Grandmother    Diabetes Maternal Grandmother    Heart disease Maternal Grandfather    Diabetes Maternal Grandfather    Clotting disorder Other        colon cancer   Anesthesia problems Neg Hx    BRCA 1/2 Neg Hx    Breast cancer Neg Hx     ALLERGIES:  is allergic to ibuprofen.  MEDICATIONS:  Current Outpatient Medications  Medication Sig Dispense Refill   Blood Glucose Monitoring Suppl (BAYER CONTOUR LINK 2.4) w/Device KIT Use Bayer Contour link meter to check blood sugar 2 times a day 1 kit 0   glucose blood test strip Use Bayer Contour test strips to check blood sugar 2 times a day 200 each 2   insulin  degludec (TRESIBA  FLEXTOUCH) 200 UNIT/ML FlexTouch Pen Inject 36 Units into the skin daily. 9 mL 3   Insulin  Pen Needle 32G X 4 MM MISC Use 1x a day 100 each 3   medroxyPROGESTERone  (DEPO-PROVERA ) 150 MG/ML injection Inject 150 mg into the muscle every 3 (three) months. Reported on 11/06/2015     Microlet Lancets MISC Use to check blood sugar 2 times a day 200 each 2   Semaglutide , 1 MG/DOSE, (OZEMPIC , 1 MG/DOSE,) 4 MG/3ML SOPN Inject 1 mg into the skin once a week. 9 mL 2   valACYclovir  (VALTREX ) 500 MG tablet Take 500 mg by mouth 2 (two) times daily. For 7 days as needed     No current facility-administered medications for this visit.    REVIEW OF SYSTEMS:   Constitutional: Denies fevers, chills or abnormal night sweats Eyes: Denies blurriness of vision, double vision or watery eyes Ears, nose, mouth, throat, and face: Denies mucositis or sore throat Respiratory: Denies cough, dyspnea or wheezes Cardiovascular: Denies palpitation, chest discomfort or lower extremity swelling Gastrointestinal:  Denies nausea, heartburn or change in bowel habits Skin: Denies abnormal skin rashes Lymphatics: Denies new  lymphadenopathy or easy bruising Neurological:Denies numbness, tingling or new weaknesses Behavioral/Psych: Mood is stable, no new changes  All other systems were reviewed with the patient and are negative.  PHYSICAL EXAMINATION: ECOG PERFORMANCE STATUS: 0 - Asymptomatic  Vitals:   06/07/24 1500  BP: 122/76  Pulse: (!) 108  Resp: 19  Temp: (!) 97.4 F (36.3 C)  SpO2: 97%   Filed Weights   06/07/24 1500  Weight: 165 lb 8 oz (75.1 kg)    GENERAL:alert, no distress and comfortable SKIN: skin color, texture, turgor are normal, no rashes or significant lesions EYES: normal, conjunctiva are pink and non-injected, sclera clear OROPHARYNX:no exudate, no erythema and lips, buccal mucosa, and tongue normal  NECK: supple, thyroid  normal size, non-tender, without nodularity LYMPH:  no palpable lymphadenopathy in the  cervical, axillary or inguinal LUNGS: clear to auscultation and percussion with normal breathing effort HEART: regular rate & rhythm and no murmurs and no lower extremity edema ABDOMEN:abdomen soft, moderate diffuse tenderness and normal bowel sounds Musculoskeletal:no cyanosis of digits and no clubbing  PSYCH: alert & oriented x 3 with fluent speech NEURO: no focal motor/sensory deficits  Physical Exam   LABORATORY DATA:  I have reviewed the data as listed    Latest Ref Rng & Units 04/25/2019   12:44 PM 08/28/2011    5:00 PM  CBC  WBC 3.4 - 10.8 x10E3/uL 4.4  7.7   Hemoglobin 11.1 - 15.9 g/dL 85.6  85.2   Hematocrit 34.0 - 46.6 % 44.2  42.9   Platelets 150 - 450 x10E3/uL 272  345        Latest Ref Rng & Units 05/17/2020    9:25 AM 06/22/2019    3:39 PM 04/25/2019   12:44 PM  CMP  Glucose 70 - 99 mg/dL 785  841  690   BUN 6 - 23 mg/dL 10  11  8    Creatinine 0.40 - 1.20 mg/dL 9.14  9.14  9.14   Sodium 135 - 145 mEq/L 137  139  136   Potassium 3.5 - 5.1 mEq/L 4.2  4.1  4.5   Chloride 96 - 112 mEq/L 105  104  101   CO2 19 - 32 mEq/L 25  20  20    Calcium 8.4 -  10.5 mg/dL 89.5  89.4  89.1   Total Protein 6.0 - 8.3 g/dL 7.0  6.9  7.3   Total Bilirubin 0.2 - 1.2 mg/dL 0.3  0.2  0.3   Alkaline Phos 39 - 117 U/L 94  118  124   AST 0 - 37 U/L 11  14  22    ALT 0 - 35 U/L 20  24  33      RADIOGRAPHIC STUDIES: I have personally reviewed the radiological images as listed and agreed with the findings in the report. CT ABDOMEN PELVIS W CONTRAST Result Date: 06/07/2024 CLINICAL DATA:  Status post recent colonoscopy. Neoplasm of digestive system. EXAM: CT ABDOMEN AND PELVIS WITH CONTRAST TECHNIQUE: Multidetector CT imaging of the abdomen and pelvis was performed using the standard protocol following bolus administration of intravenous contrast. RADIATION DOSE REDUCTION: This exam was performed according to the departmental dose-optimization program which includes automated exposure control, adjustment of the mA and/or kV according to patient size and/or use of iterative reconstruction technique. CONTRAST:  100mL ISOVUE-300 IOPAMIDOL (ISOVUE-300) INJECTION 61% COMPARISON:  None Available. FINDINGS: Lower chest: The visualized lung bases are clear. No intra-abdominal free air or free fluid. Hepatobiliary: There is a 2 x 2 cm hypoenhancing lesion in the dome of the liver suspicious for metastatic disease. No biliary dilatation. Multiple gallstones. No pericholecystic fluid or evidence of acute cholecystitis by CT. Pancreas: Unremarkable. No pancreatic ductal dilatation or surrounding inflammatory changes. Spleen: Normal in size without focal abnormality. Adrenals/Urinary Tract: The right adrenal glands unremarkable. There is a 2 cm left adrenal nodule which is not characterized on this CT. Metastatic disease is not excluded. There is a 4 cm right renal inferior pole cyst. There is no hydronephrosis on either side. The visualized ureters and urinary bladder appear unremarkable. Stomach/Bowel: There is segmental thickening of the sigmoid colon involving a proximally 8 cm long  segment of the sigmoid colon. There is a 6 x 6 cm exophytic mass in the pelvis which appears to arise from the sigmoid  colon in keeping with provided history of malignancy. There is distal colonic diverticulosis. There is no bowel obstruction or active inflammation. The appendix is normal. Vascular/Lymphatic: The abdominal aorta and IVC are unremarkable. No portal venous gas. There is no adenopathy. Reproductive: The uterus is anteverted. No suspicious adnexal masses. Other: None Musculoskeletal: No acute or significant osseous findings. IMPRESSION: 1. Sigmoid colon mass in keeping with provided history of malignancy. 2. A 2 cm hypoenhancing lesion in the dome of the liver suspicious for metastatic disease. 3. A 2 cm left adrenal nodule is not characterized on this CT. Metastatic disease is not excluded. MRI without and with contrast may provide better evaluation of the left adrenal and hepatic lesions. 4. Cholelithiasis. Electronically Signed   By: Vanetta Chou M.D.   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  Colonoscopy showed a partially obstructive mass in the sigmoid colon, biopsy showed invasive poorly differentiated adenocarcinoma. High-grade and aggressive tumor. CT scan shows suspicious liver lesions and a spot in the left adrenal gland which is indeterminate.  - I recommend abdominal MRI with and without contrast for further evaluation and CT chest for further staging. Potential for metastasis to liver and adrenal gland.  - Due to her young age, I will request MMR on her biopsy, to rule out Lynch syndrome, -Depending on the MRI results, I will likely order liver biopsy to confirm liver metastasis.  If biopsy is not feasible, I will obtain a PET scan for more information. -We discussed that even with oligo liver metastasis, I will Gluco is still  curative, with chemotherapy and surgery, or liver targeted therapy. - Scheduled consultation with colorectal surgeon Dr. Debby for potential surgery. - We briefly discussed chemo regimen, such as FOLFOX, CAPOX or FOLFOXIRI.  She is open to treatment recommendations.   Type 2 diabetes mellitus, well controlled Type 2 diabetes mellitus, well controlled with A1c at 6.7 and normal glucose levels. Managed with Tresiba  and Ozempic .  Hypercalcemia, under evaluation Recent hypercalcemia under evaluation. Previous labs showed elevated calcium levels. PTH levels are normal for age. - Ordered repeat labs including CBC, liver function tests, tumor markers, and iron studies.  Constipation associated with colon cancer Constipation associated with partially obstructing sigmoid colon adenocarcinoma. Symptoms include severe pain and alternating constipation and diarrhea. Recent colonoscopy may have contributed to current symptoms. - Recommended Miralax for constipation management, adjust dosage as needed. - Advised dietary modifications to reduce fiber intake to ease bowel movements.  Significant weight loss associated with malignancy Significant weight loss of approximately 65 pounds over the past year, associated with malignancy and possibly exacerbated by dental work and diabetes management. - Referred to dietitian for nutritional support and weight management. - Consider nutritional supplements to support weight gain and nutritional needs.  Genetics - Due to her young age, all recommended genetic testing even if her tumor MMR is intact - Will make a genetic referral.  Plan - I reviewed and discussed her colonoscopy, biopsy, and CT scan imaging results with patient and her family today in detail. - Will obtain abdominal MRI with and without contrast to further evaluate the liver and left adrenal gland lesions, and CT chest without contrast to complete staging - She will see colorectal surgeon Dr.  Debby next week - Will review her case in our multidisciplinary GI conference - Will likely obtain liver biopsy if MRI shows high suspicion for metastasis. -  Plan to see her back in about 2 weeks to finalize her treatment plan.   Orders Placed This Encounter  Procedures   CT Chest Wo Contrast    Standing Status:   Future    Expected Date:   06/14/2024    Expiration Date:   06/07/2025    Is patient pregnant?:   No    Preferred imaging location?:   [facility]   MR Abdomen W Wo Contrast    Standing Status:   Future    Expected Date:   06/14/2024    Expiration Date:   06/07/2025    If indicated for the ordered procedure, I authorize the administration of contrast media per Radiology protocol:   Yes    What is the patient's sedation requirement?:   No Sedation    Does the patient have a pacemaker or implanted devices?:   No    Preferred imaging location?:   [facility] (table limit - 500lbs)   CBC with Differential/Platelet    Standing Status:   Standing    Number of Occurrences:   50    Expiration Date:   06/07/2025   CEA (Access)    Standing Status:   Standing    Number of Occurrences:   30    Expiration Date:   06/07/2025   Comprehensive metabolic panel with GFR    Standing Status:   Standing    Number of Occurrences:   50    Expiration Date:   06/07/2025   Ferritin    Standing Status:   Standing    Number of Occurrences:   20    Expiration Date:   06/07/2025    All questions were answered. The patient knows to call the clinic with any problems, questions or concerns. I spent 45 minutes counseling the patient face to face. The total time spent in the appointment was 60 minutes including review of chart and various tests results, discussions about plan of care and coordination of care plan.     Onita Mattock, MD 06/07/2024 3:57 PM

## 2024-06-07 NOTE — Progress Notes (Signed)
 PATIENT NAVIGATOR PROGRESS NOTE  Name: Ruth Mckinney Date: 06/07/2024 MRN: [id number]  DOB: 30-Dec-1975   Reason for visit:  Initial Med/Onc visit with Dr. Onita Mattock  Comments:   Patient was seen during her initial visit with Dr. Mattock. Patient was accompanied by her mother and her brother. Patient was given Journey's binder with information specific to her diagnosis.  Patient was also given my direct contact information and was instructed to contact office with any question or concerns. Patient verbalized understanding.   SDOH assessment was completed and no urgent needs were noted. Referrals entered for Social Work, Nutrition, and Genetics.    Time spent counseling/coordinating care: > 60 minutes

## 2024-06-07 NOTE — Research (Cosign Needed Addendum)
 [employer] 2021-05 - Specimen Collection Study to Evaluate Biomarkers in Subjects with Cancer    Patient Ruth Mckinney was identified by Dr. Lanny as a potential candidate for the above listed study.  This Clinical Research Coordinator met with Ruth Mckinney, [id number] on 06/07/24 in a manner and location that ensures patient privacy to discuss participation in the above listed research study.  Patient is Accompanied by mother and brother.  Patient was previously provided with informed consent documents.  Patient has not yet read the informed consent documents and so documents were reviewed page by page today.  As outlined in the informed consent form, this Coordinator and Ruth Mckinney discussed the purpose of the research study, the investigational nature of the study, study procedures and requirements for study participation, potential risks and benefits of study participation, as well as alternatives to participation.  This study is not blinded or double-blinded. The patient understands participation is voluntary and they may withdraw from study participation at any time.  This study does not involve randomization.  This study does not involve an investigational drug or device. This study does not involve a placebo. Patient understands enrollment is pending full eligibility review.   Confidentiality and how the patient's information will be used as part of study participation were discussed.  Patient was informed there is reimbursement provided for their time and effort spent on trial participation.  The patient is encouraged to discuss research study participation with their insurance provider to determine what costs they may incur as part of study participation, including research related injury.    All questions were answered to patient's satisfaction.  The informed consent with embedded HIPAA language was reviewed page by page.  The patient's mental and emotional status is  appropriate to provide informed consent, and the patient verbalizes an understanding of study participation.  Patient has agreed to participate in the above listed research study and has voluntarily signed the informed consent version 11 Apr 2024 with embedded HIPAA language, version 11 Apr 2024 on 06/07/2024 at 4:15 PM.  The patient was provided with a copy of the signed informed consent form with embedded HIPAA language for their reference.  No study specific procedures were obtained prior to the signing of the informed consent document.  Approximately 20 minutes were spent with the patient reviewing the informed consent documents.  Patient was not requested to complete a Release of Information form.    Medical History: This RN/Coordinator reviewed the medical history as reported in the patient's medical record with the participant.  In addition, the participant was asked to report any new medical conditions not previously recorded on the medical history form.   Was the current medical history form correct?   Yes Are there any new medical conditions to report?  No   Based on the review of the medical chart and the patient's responses, all reportable medical history events will be entered for study reporting purposes.     Data Collection: Patient was interviewed to collect the following information.  Does participant have a history of: High Blood Pressure   No  Has participant been diagnosed with: Coronary Artery Disease                No Myocardial Infarction                       No Congestive Heart Failure               No Peripheral Vascular  Disease          No Cerebrovascular Disease              No Chronic Pulmonary Disease             No COPD (incl Emphysema,Chronic Bronchitis)    No Lupus                               No Rheumatoid Arthritis         No Rheumatoid Disease         No  Does participant have a history of: Diabetes                  Yes            Type 2                            If yes, was there end Organ damage?   No  Has participant been diagnosed with: Dementia                         No Hemiplegia or Paraplegia No Barrett's Esophagus       No Gastric Ulcer                 No Peptic Ulcer Disease      No Mild Liver Disease           No Moderate or Severe Liver Disease  No Liver Cirrhosis                                 No Helicobacter Pylori (H. Pylori)          No Pancreatitis                                       No Renal Disease                                No Chronic Kidney Disease (CKD)   No   Ulcerative Colitis     No Crohn's Disease    No Colorectal Polyps   No Lynch Syndrome    No Hepatitis B or C     No   Is the participant currently taking a magnesium supplement?   No  Does the participant have a personal history of cancer (greater than 5 years ago)?  No  Does the participant have a family history of cancer in 1st or 2nd degree relatives? No If yes, Relationship(s) and Cancer type(s)? Mother- thyroid   Does the participant have history of alcohol consumption? Yes   If yes, current or former? current Number of years? 21-current; 28 yrs Drinks per week? .5  Does the participant have a history of cigarette, cigar, pipe, or chewing tobacco use?  Yes  If yes, current for former? former If yes, type (Cigarette, cigar, pipe, and/or chewing tobacco)? cigarette   If former, year stopped? 2020 Number of years? 8007-7979; 28 yrs Packs/number/containers per day? 1  Eligibility: Patient came back in on Friday Oct 31st 2025. Eligibility criteria reviewed with patient.  This nurse/coordinator has reviewed this patient's inclusion and exclusion  criteria and confirmed patient is eligible for study participation. Eligibility confirmed by treating investigator, Dr. Lanny, who also agrees that patient should proceed with enrollment.  Blood Collection: Research blood obtained by fresh venipuncture on Friday Oct. 31st 2025. Patient tolerated  well without any adverse events.  Gift Card: $50 gift card given to patient by Clinical Research Specialist, Almarie Harden, on Friday Oct 31st, for her participation in this study.      Patient was thanked for their participation in this study.    Laury Quale, MPH  Clinical Research Coordinator

## 2024-06-08 LAB — CEA (ACCESS): CEA (CHCC): 10.71 ng/mL — ABNORMAL HIGH (ref 0.00–5.00)

## 2024-06-09 ENCOUNTER — Other Ambulatory Visit: Payer: Self-pay

## 2024-06-09 ENCOUNTER — Other Ambulatory Visit: Payer: Self-pay | Admitting: *Deleted

## 2024-06-09 ENCOUNTER — Inpatient Hospital Stay

## 2024-06-09 ENCOUNTER — Encounter: Payer: Self-pay | Admitting: *Deleted

## 2024-06-09 ENCOUNTER — Encounter: Payer: Self-pay | Admitting: Internal Medicine

## 2024-06-09 DIAGNOSIS — Z808 Family history of malignant neoplasm of other organs or systems: Secondary | ICD-10-CM

## 2024-06-09 DIAGNOSIS — C187 Malignant neoplasm of sigmoid colon: Secondary | ICD-10-CM

## 2024-06-09 DIAGNOSIS — Z1379 Encounter for other screening for genetic and chromosomal anomalies: Secondary | ICD-10-CM

## 2024-06-09 DIAGNOSIS — Z800 Family history of malignant neoplasm of digestive organs: Secondary | ICD-10-CM

## 2024-06-09 DIAGNOSIS — D500 Iron deficiency anemia secondary to blood loss (chronic): Secondary | ICD-10-CM

## 2024-06-09 DIAGNOSIS — C189 Malignant neoplasm of colon, unspecified: Secondary | ICD-10-CM

## 2024-06-09 LAB — RESEARCH LABS

## 2024-06-09 LAB — GENETIC SCREENING ORDER

## 2024-06-09 NOTE — Progress Notes (Addendum)
 REFERRING PROVIDER: Lanny Callander, MD [address] [place],  [place] [address]  PRIMARY PROVIDER:  Dwight Trula SQUIBB, MD  PRIMARY REASON FOR VISIT:  1. Cancer of sigmoid colon (HCC)   2. Family history of colon cancer   3. Family history of thyroid  cancer    HISTORY OF PRESENT ILLNESS:   Ruth Mckinney, a 48 y.o. female, was seen for a [place] cancer genetics consultation at the request of Callander Lanny, MD due to a personal history of colon cancer.  Ruth Mckinney presents to clinic today to discuss the possibility of a hereditary predisposition to cancer, genetic testing, and to further clarify her future cancer risks, as well as potential cancer risks for family members.   Diagnosis: In October 2025, at the age of 59, Ruth Mckinney was diagnosed with cancer of sigmoid colon.  CANCER HISTORY:  Oncology History  Cancer of sigmoid colon (HCC)  06/06/2024 Initial Diagnosis   Cancer of sigmoid colon (HCC)   06/05/2024  Surgical  Pathology and Microsatellite Instability (MSI) Testing     RISK FACTORS:  Menarche was at age 60.  First live birth at age 61.  OCP use for approximately 2-3 years.  Ovaries intact: yes.  Hysterectomy: no.  Menopausal status: premenopausal.  HRT use: 0 years. Colonoscopy: yes; abnormal. Mammogram within the last year: no. Number of breast biopsies: 0. Up to date with pelvic exams: yes. Any excessive radiation exposure or other environmental exposures the past: no Tobacco Use: Former  Past Medical History:  Diagnosis Date   Abnormal Pap smear    Anemia    Diabetes mellitus (HCC)    Family history of thyroid  cancer    Mother at 41   Genital herpes    Past Surgical History:  Procedure Laterality Date   ADENOIDECTOMY     CESAREAN SECTION     COLPOSCOPY     IR IMAGING GUIDED PORT INSERTION  06/30/2024   LEEP     MOUTH SURGERY     Social History   Socioeconomic History   Marital status: Single    Spouse name: Not on file    Number of children: 2   Years of education: Not on file   Highest education level: Not on file  Occupational History   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    Social Drivers of Corporate Investment Banker Strain: Not on file  Food Insecurity: No Food Insecurity (06/07/2024)   Hunger Vital Sign    Worried About Running Out of Food in the Last Year: Never true    Ran Out of Food in the Last Year: Never true  Transportation Needs: No Transportation Needs (06/07/2024)   PRAPARE - Administrator, Civil Service (Medical): No    Lack of Transportation (Non-Medical): No  Physical Activity: Not on file  Stress:  Not on file  Social Connections: Not on file    FAMILY HISTORY:  We obtained a detailed, 4-generation family history pasted below.   Ruth Mckinney is unaware of relatives completing genetic testing for hereditary cancer risks.   Significant diagnoses are listed below: Family History  Problem Relation Age of Onset   Hypertension Mother    Diabetes Mother    Anemia Mother    Migraines Mother    Thyroid  cancer Mother 23   Hypertension Maternal Grandmother    Diabetes Maternal Grandmother    Heart disease Maternal Grandfather    Diabetes Maternal Grandfather    Colon cancer Maternal Great-grandfather    Anesthesia problems Neg Hx    BRCA 1/2 Neg Hx    Breast cancer Neg Hx      GENETIC COUNSELING ASSESSMENT: Ruth Mckinney is a 48  y.o. female with a personal history of colon cancer which is somewhat suggestive of a hereditary cancer predisposition syndrome. We, therefore, discussed and recommended the following at today's visit.   DISCUSSION: We discussed that, in general, most cancer is not inherited in families, but instead is sporadic or familial. Sporadic cancers occur by chance and typically happen at older ages (>50 years) as this type of cancer is caused by genetic changes acquired during an individual's lifetime. Some families have more cancers than would be expected by chance; however, the ages or types of cancer are not consistent with a known genetic mutation or known genetic mutations have been ruled out. This type of familial cancer is thought to be due to a combination of multiple genetic, environmental, hormonal, and lifestyle factors. While this combination of factors likely increases the risk of cancer, the exact source of this risk is not currently identifiable or testable.  We discussed that 5-10% of cancer is the result of germline (heritable) genetic variants, with most cases associated with BRCA1/BRCA2. There are other genes that can be associated with hereditary cancer syndromes. These include  MSH6, PMS2, MLH1, MSH2 all associated with Lynch Syndrome. Since Ruth Mckinney's MSI testing found that all four genes are preserved (see snip above) we are less suspicious for Lynch Syndrome. We discussed that testing is beneficial for several reasons including knowing how to follow individuals after completing their treatment, identifying whether potential treatment options such as PARP inhibitors would be beneficial, and understanding if other family members could be at risk for cancer and allow them to undergo genetic testing.   We reviewed the characteristics, features and inheritance patterns of hereditary cancer syndromes. We also discussed genetic testing, including the appropriate family members to test, the process of  testing, insurance coverage and turn-around-time for results. We discussed the implications of a negative, positive, carrier and/or variant of uncertain significant result. Ruth Mckinney  was offered a common hereditary cancer Mckinney (40 genes) and an expanded pan-cancer Mckinney (77 genes). Ruth Mckinney was informed of the benefits and limitations of each Mckinney, including that expanded pan-cancer panels contain genes that do not have clear management guidelines at this point in time.  We also discussed that as the number of genes included on a Mckinney increases, the chances of variants of uncertain significance increases.  GENETIC TESTING NATIONAL CRITERIA: Based on [employer] personal history of colon cancer below age 40 she meets medical criteria for genetic testing based on the [employer] (NCCN) guidelines. Despite that she meets criteria, she may still have an out of pocket cost. We discussed that if her out of pocket cost for  testing is over $100, the laboratory will call and confirm whether she wants to proceed with testing.  If the out of pocket cost of testing is less than $100 she will be billed by the genetic testing laboratory.   GENETIC TESTING CONSENT:  After considering the risks, benefits, and limitations, Ruth Mckinney provided informed consent to pursue genetic testing. A blood sample was sent to [facility] for analysis of the CancerNext-Expanded+RNA Mckinney. Results should be available within approximately 10 days time, at which point they will be disclosed by telephone to Ruth Mckinney , as will any additional recommendations warranted by these results. Ruth Mckinney will receive a summary of her genetic counseling visit and a copy of her results once available. This information will also be available in Epic.  Ruth Mckinney which includes sequencing, rearrangement, and RNA analysis for the following 77  genes: AIP, ALK, APC, ATM, AXIN2, BAP1, BARD1, BMPR1A, BRCA1, BRCA2, BRIP1, CDC73, CDH1, CDK4, CDKN1B, CDKN2A, CEBPA, CHEK2, CTNNA1, DDX41, DICER1, ETV6, FH, FLCN, GATA2, LZTR1, MAX, MBD4, MEN1, MET, MLH1, MSH2, MSH3, MSH6, MUTYH, NF1, NF2, NTHL1, PALB2, PHOX2B, PMS2, POT1, PRKAR1A, PTCH1, PTEN, RAD51C, RAD51D, RB1, RET, RPS20, RUNX1, SDHA, SDHAF2, SDHB, SDHC, SDHD, SMAD4, SMARCA4, SMARCB1, SMARCE1, STK11, SUFU, TMEM127, TP53, TSC1, TSC2, VHL, and WT1 (sequencing and deletion/duplication); EGFR, HOXB13, KIT, MITF, PDGFRA, POLD1, and POLE (sequencing only); EPCAM and GREM1 (deletion/duplication only).    GENETIC INFORMATION NONDISCRIMINATION ACT (GINA): We discussed that some people do not want to undergo genetic testing due to fear of genetic discrimination.  The Genetic Information Nondiscrimination Act (GINA) was signed into federal law in 2008. GINA prohibits health insurers and most employers from discriminating against individuals based on genetic information (including the results of genetic tests and family history information). According to GINA, health insurance companies cannot consider genetic information to be a preexisting condition, nor can they use it to make decisions regarding coverage or rates. GINA also makes it illegal for most employers to use genetic information in making decisions about hiring, firing, promotion, or terms of employment. It is important to note that GINA does not offer protections for life insurance, disability insurance, or long-term care insurance. GINA does not apply to those in the [employer], those who work for companies with less than 15 employees, and new life insurance or long-term disability insurance policies.  Health status due to a cancer diagnosis is not protected under GINA. More information about GINA can be found by visiting [employer]cl[employer]be.  Lastly, we encouraged Ruth Mckinney to remain in contact with cancer genetics annually so that we can continuously  update the family history and inform her of any changes in cancer genetics and testing that may be of benefit for this family.   Ruth Mckinney's questions were answered to her satisfaction today. Our contact information was provided should additional questions or concerns arise. Thank you for the referral and allowing us  to share in the care of your patient.   Resources:  Ruth Mckinney was provided with the following:  [facility] CancerNext-Expanded Gene Mckinney List  [facility] Hereditary Cancer Testing Patient Guide  PLAN:  Testing Ordered: STAT CancerNext-Expanded+RNA Mckinney Clinic Note Faxed/Routed to [employer] PCP Dwight Trula SQUIBB, MD  [employer] Patient Assistance formed completed in clinic today (OOP $0.00)  Retha Bither Lindsey-Mills, MS, [facility]  Certified Genetic Counselor  Email: [email or web address]  Phone: [phone]  I personally spent a total of 60 minutes in the care of the patient today including preparing to see the  patient, counseling and educating, placing orders, and documenting clinical information in the EHR.  The patient brought her mother and brother to the visit. Drs. Lanny Stalls, and/or Gudena were available for questions, if needed. _______________________________________________________________________ For Office Staff:  Number of people involved in session: 3 Was an Intern/ student involved with case: no

## 2024-06-09 NOTE — Research (Signed)
 [employer] 2021-05 - Specimen Collection Study to Evaluate Biomarkers in Subjects with Cancer     This Nurse has reviewed this patient's inclusion and exclusion criteria as a second review and confirms Ruth Mckinney is eligible for study participation.  Patient may continue with enrollment.   Mazie Larsen, RN, BSN Clinical Research Nurse [phone] 06/09/2024

## 2024-06-13 DIAGNOSIS — Z808 Family history of malignant neoplasm of other organs or systems: Secondary | ICD-10-CM | POA: Insufficient documentation

## 2024-06-13 DIAGNOSIS — Z800 Family history of malignant neoplasm of digestive organs: Secondary | ICD-10-CM | POA: Insufficient documentation

## 2024-06-14 ENCOUNTER — Ambulatory Visit (HOSPITAL_COMMUNITY)
Admission: RE | Admit: 2024-06-14 | Discharge: 2024-06-14 | Disposition: A | Discharge status: Home / Self Care | Source: Ambulatory Visit | Attending: Hematology | Admitting: Hematology

## 2024-06-14 ENCOUNTER — Inpatient Hospital Stay: Attending: Hematology | Admitting: Dietician

## 2024-06-14 ENCOUNTER — Other Ambulatory Visit: Payer: Self-pay

## 2024-06-14 ENCOUNTER — Other Ambulatory Visit: Payer: Self-pay | Admitting: *Deleted

## 2024-06-14 ENCOUNTER — Inpatient Hospital Stay: Admitting: Hematology

## 2024-06-14 ENCOUNTER — Encounter: Payer: Self-pay | Admitting: Hematology

## 2024-06-14 DIAGNOSIS — K5900 Constipation, unspecified: Secondary | ICD-10-CM | POA: Insufficient documentation

## 2024-06-14 DIAGNOSIS — Z886 Allergy status to analgesic agent status: Secondary | ICD-10-CM | POA: Insufficient documentation

## 2024-06-14 DIAGNOSIS — C187 Malignant neoplasm of sigmoid colon: Secondary | ICD-10-CM | POA: Diagnosis not present

## 2024-06-14 DIAGNOSIS — R110 Nausea: Secondary | ICD-10-CM | POA: Insufficient documentation

## 2024-06-14 DIAGNOSIS — R160 Hepatomegaly, not elsewhere classified: Secondary | ICD-10-CM | POA: Insufficient documentation

## 2024-06-14 DIAGNOSIS — Z79899 Other long term (current) drug therapy: Secondary | ICD-10-CM | POA: Insufficient documentation

## 2024-06-14 DIAGNOSIS — K769 Liver disease, unspecified: Secondary | ICD-10-CM | POA: Insufficient documentation

## 2024-06-14 DIAGNOSIS — Z9089 Acquired absence of other organs: Secondary | ICD-10-CM | POA: Insufficient documentation

## 2024-06-14 DIAGNOSIS — Z808 Family history of malignant neoplasm of other organs or systems: Secondary | ICD-10-CM | POA: Insufficient documentation

## 2024-06-14 MED ORDER — GADOBUTROL 1 MMOL/ML IV SOLN
7.0000 mL | Freq: Once | INTRAVENOUS | Status: AC | PRN
  Administered 2024-06-14: 7 mL via INTRAVENOUS

## 2024-06-14 NOTE — Progress Notes (Signed)
 Nutrition Assessment  Reason for Assessment: Referral (colon cancer - low fiber diet)   ASSESSMENT: 48 year old female with DM2 with newly diagnosed sigmoid colon cancer metastatic to liver. Treatment plan is currently under work-up. Patient is under the care of Dr. Lanny.   Met with patient and her mother in office. Patient doing okay, feeling overwhelmed with diagnosis and appointments currently. She reports decreased appetite and constipation ongoing the last several months. Patient attributed symptoms to recent dental work s/p upper partial. She has been on ozempic  x4 years and has lost significant amount of weight. PCP has reduced dose to 1 mg/week given diagnosis. Patient usually eats 3 smaller meals/snacks. Recalls eggs, grits, and bacon for breakfast. Frozen meals for lunch when at work (chicken pot pie, chicken pasta with black olives, Italian dressing. Ate little smokies, veggies, and mac/cheese for dinner. Likes fish and chicken, eats little pork. Has not been eating red meat s/p dentures. Patient likes to snack on raw vegetables and dip.    Nutrition Focused Physical Exam: deferred    Medications: ozempic  (1mg ), insulin  tresiba  36 units/day   Labs: 10/29 - glucose 119, BUN <5, Ca 11.4, alk phos 129   Anthropometrics:   Height: 5'7 Weight: 165 lb 8 oz UBW: 225 lb (prior to ozempic  per pt) BMI: 25.92  NUTRITION DIAGNOSIS: Food and nutrition related knowledge deficit related to newly diagnosed colon cancer as evidenced by no prior need for low fiber diet education    INTERVENTION:  Continue bowel regimen, may need increased dose of miralax - suggested trying over weekend when at home Discussed importance of adequate hydration - she will work to increase daily fluids Educated on rationale for low fiber diet, discussed foods to limit/avoid - handout with list of foods provided  Discussed foods with protein, recommend protein source at every meal Suggested use of ONS for added  calories/protein given poor appetite - samples + coupons provided Contact information    MONITORING, EVALUATION, GOAL: Patient will tolerate adequate calories and protein to minimize wt loss during treatment    Next Visit: To be scheduled - treatment plan under work-up

## 2024-06-14 NOTE — Progress Notes (Signed)
 PATIENT NAVIGATOR PROGRESS NOTE  Name: Ruth Mckinney Date: 06/14/2024 MRN: [id number]  DOB: June 26, 1976  Per Dr. Demetra request, called to inform patient that a phone visit will be scheduled for this afternoon to discuss patient's scan results and next steps.  Patient verbalized understanding and agreement with plan.  Message was sent to scheduler to add on phone visit to Dr. Demetra scheduled.   Patient also inquired about getting a physician's note for a Stand-up desk for work.  Patient stated her work place has informed her she would need a physician note stating she would need a stand-up desk due to inability to sit for prolonged periods of time.  Informed patient I would work with Dr. Lanny to get her the physician's note.  Patient requested for note to be emailed to her.    Time spent with patient care: 45 minutes.

## 2024-06-14 NOTE — Progress Notes (Signed)
 The proposed treatment discussed in conference is for discussion purpose only and is not a binding recommendation.  The patients have not been physically examined, or presented with their treatment options.  Therefore, final treatment plans cannot be decided.

## 2024-06-14 NOTE — Progress Notes (Signed)
 [facility]   [phone] [phone]   Clinic Follow up Note   Patient Care Team: Dwight Trula SQUIBB, MD as PCP - General (Internal Medicine) Ardis Evalene CROME, RN as Oncology Nurse Navigator 06/14/2024  I connected with Edsel Pouch on 06/14/24 at  3:40 PM EST by telephone and verified that I am speaking with the correct person using two identifiers.   I discussed the limitations, risks, security and privacy concerns of performing an evaluation and management service by telephone and the availability of in person appointments. I also discussed with the patient that there may be a patient responsible charge related to this service. The patient expressed understanding and agreed to proceed.   Patient's location:  Home  Provider's location:  Office    CHIEF COMPLAINT: Follow-up scan findings   CURRENT THERAPY: pending   Oncology history No problem-specific Assessment & Plan notes found for this encounter.  Assessment & Plan Rectosigmoid cancer (malignant neoplasm of sigmoid colon) with suspected liver metastases -I reviewed her recent images.  CT chest shows no metastasis, and left adrenal gland is benign on MRI. MRI reveals five liver lesions, with the largest being 1.9 cm in the right lobe.  These are very suspicious for liver metastasis.  Biopsy of the largest lesion is planned to confirm metastasis. will refer her to IR - Due to her metastatic disease, will likely start her on chemotherapy first, unless she needs a urgent diverting ostomy.   -Surgery alone is not curative due to liver metastasis, but chemotherapy is crucial. The decision to start chemotherapy depends on bowel management with Miralax. If Miralax is effective, chemotherapy will be initiated. If not, a diverting ostomy may be necessary before chemotherapy. The benefit of chemotherapy includes shrink  ing the primary tumor and liver lesions, potentially making surgery easier and  addressing microscopic disease. The decision to proceed with surgery will depend on the number of metastatic lesions, response to chemotherapy, and overall health. - Ordered liver biopsy of the largest lesion. - Scheduled appointment with Dr. Debby to discuss potential diverting ostomy. - Increase Miralax to manage bowel movements. - If Miralax is effective, will start chemotherapy. - If Miralax is ineffective, will consider diverting ostomy before chemotherapy. - Will schedule port placement for chemotherapy administration. - Will discuss chemotherapy regimen, side effects, and management after biopsy results.  Constipation secondary to rectal cancer Constipation symptoms due to a large tumor in the sigmoid colon, close to the uterus and ureter, causing discomfort and pressure. Symptoms include infrequent bowel movements and abdominal soreness. Miralax is being used to manage symptoms. If Miralax is ineffective, a diverting ostomy may be necessary to bypass the tumor and allow bowel movements. - Increase Miralax to manage bowel movements. - Scheduled appointment with Dr. Debby to discuss potential diverting ostomy. - If Miralax is ineffective, will consider diverting ostomy.   Plan - I personally reviewed her CT chest and abdominal MRI images, and discussed the findings with her in detail. - Refer to IR for ultrasound-guided liver biopsy - Will write a letter for her using a standing desk due to her rectal discomfort when she sits. - She is scheduled to see Dr. Debby next week - Will see her back after liver biopsy - Port placement in next 1 to 2 weeks  SUMMARY OF ONCOLOGIC HISTORY: Oncology History  Cancer of sigmoid colon (HCC)  06/06/2024 Initial Diagnosis   Cancer of sigmoid colon (HCC)     Discussed the use of AI  scribe software for clinical note transcription with the patient, who gave verbal consent to proceed.  History of Present Illness Ruth Mckinney is a 48 year  old female with rectal cancer who presents for a follow-up visit.  Recent imaging studies, including a CT scan and MRI, reveal multiple liver lesions. The CT scan shows a large lesion measuring 1.9 cm at the top of the liver, while the MRI identifies five lesions ranging from 0.7 to 1.5 cm.  She experiences bowel movement difficulties, having not had a bowel movement since her CT scan last week, which initially caused diarrhea. She uses Miralax twice daily and had a small bowel movement today. Soreness and pressure in the lower abdomen, near the uterus, worsen when sitting.  Current medications include Miralax, taken twice daily, to aid with bowel movements.     REVIEW OF SYSTEMS:   Constitutional: Denies fevers, chills or abnormal weight loss Eyes: Denies blurriness of vision Ears, nose, mouth, throat, and face: Denies mucositis or sore throat Respiratory: Denies cough, dyspnea or wheezes Cardiovascular: Denies palpitation, chest discomfort or lower extremity swelling Gastrointestinal:  Denies nausea, heartburn or change in bowel habits Skin: Denies abnormal skin rashes Lymphatics: Denies new lymphadenopathy or easy bruising Neurological:Denies numbness, tingling or new weaknesses Behavioral/Psych: Mood is stable, no new changes  All other systems were reviewed with the patient and are negative.  MEDICAL HISTORY:  Past Medical History:  Diagnosis Date   Abnormal Pap smear    Anemia    Diabetes mellitus (HCC)    Family history of thyroid  cancer    Mother at 70   Genital herpes     SURGICAL HISTORY: Past Surgical History:  Procedure Laterality Date   ADENOIDECTOMY     CESAREAN SECTION     COLPOSCOPY     LEEP     MOUTH SURGERY      I have reviewed the social history and family history with the patient and they are unchanged from previous note.  ALLERGIES:  is allergic to ibuprofen.  MEDICATIONS:  Current Outpatient Medications  Medication Sig Dispense Refill   Blood  Glucose Monitoring Suppl (BAYER CONTOUR LINK 2.4) w/Device KIT Use Bayer Contour link meter to check blood sugar 2 times a day 1 kit 0   glucose blood test strip Use Bayer Contour test strips to check blood sugar 2 times a day 200 each 2   insulin  degludec (TRESIBA  FLEXTOUCH) 200 UNIT/ML FlexTouch Pen Inject 36 Units into the skin daily. 9 mL 3   Insulin  Pen Needle 32G X 4 MM MISC Use 1x a day 100 each 3   medroxyPROGESTERone  (DEPO-PROVERA ) 150 MG/ML injection Inject 150 mg into the muscle every 3 (three) months. Reported on 11/06/2015     Microlet Lancets MISC Use to check blood sugar 2 times a day 200 each 2   Semaglutide , 1 MG/DOSE, (OZEMPIC , 1 MG/DOSE,) 4 MG/3ML SOPN Inject 1 mg into the skin once a week. 9 mL 2   valACYclovir  (VALTREX ) 500 MG tablet Take 500 mg by mouth 2 (two) times daily. For 7 days as needed     No current facility-administered medications for this visit.    PHYSICAL EXAMINATION: Not performed   LABORATORY DATA:  I have reviewed the data as listed    Latest Ref Rng & Units 06/07/2024    4:27 PM 04/25/2019   12:44 PM 08/28/2011    5:00 PM  CBC  WBC 4.0 - 10.5 K/uL 5.3  4.4  7.7   Hemoglobin  12.0 - 15.0 g/dL 86.7  85.6  85.2   Hematocrit 36.0 - 46.0 % 40.1  44.2  42.9   Platelets 150 - 400 K/uL 225  272  345         Latest Ref Rng & Units 06/07/2024    4:27 PM 05/17/2020    9:25 AM 06/22/2019    3:39 PM  CMP  Glucose 70 - 99 mg/dL 880  785  841   BUN 6 - 20 mg/dL 5  10  11    Creatinine 0.44 - 1.00 mg/dL 9.23  9.14  9.14   Sodium 135 - 145 mmol/L 140  137  139   Potassium 3.5 - 5.1 mmol/L 4.3  4.2  4.1   Chloride 98 - 111 mmol/L 105  105  104   CO2 22 - 32 mmol/L 24  25  20    Calcium 8.9 - 10.3 mg/dL 88.5  89.5  89.4   Total Protein 6.5 - 8.1 g/dL 7.7  7.0  6.9   Total Bilirubin 0.0 - 1.2 mg/dL 0.4  0.3  0.2   Alkaline Phos 38 - 126 U/L 129  94  118   AST 15 - 41 U/L 19  11  14    ALT 0 - 44 U/L 8  20  24        RADIOGRAPHIC STUDIES: I have  personally reviewed the radiological images as listed and agreed with the findings in the report. MR Abdomen W Wo Contrast Result Date: 06/14/2024 CLINICAL DATA:  Characterize liver lesion, colon cancer, metastatic disease suspected EXAM: MRI ABDOMEN WITHOUT AND WITH CONTRAST TECHNIQUE: Multiplanar multisequence MR imaging of the abdomen was performed both before and after the administration of intravenous contrast. CONTRAST:  7mL GADAVIST GADOBUTROL 1 MMOL/ML IV SOLN COMPARISON:  06/07/2024 FINDINGS: Lower chest: No acute abnormality. Hepatobiliary: Hypoenhancing, diffusion restricting lesion of the superior liver dome, hepatic segment IVA, measuring 1.9 x 1.8 cm (series 12, image 18, series 7, image 7). Additional small diffusion restricting lesions scattered throughout the liver with variable enhancement, including a hypoenhancing lesion in the anterior left lobe of the liver, hepatic segment III measuring 1.0 cm (series 7, image 14), the superior left lobe of the liver, hepatic segment II, measuring 1.5 x 1.5 cm (series 7, image 12), a heterogeneously hyperenhancing lesion in the peripheral posterior right lobe of the liver, hepatic segment VI/VII measuring 1.5 x 1.3 cm (series 7, image 17), and a hyperenhancing lesion in the posterior liver dome, hepatic segment VII, measuring 0.7 cm (series 12, image 18). Small gallstones. No gallbladder wall thickening. No biliary ductal dilatation. Pancreas: Unremarkable. No pancreatic ductal dilatation or surrounding inflammatory changes. Spleen: Normal in size without significant abnormality. Adrenals/Urinary Tract: Benign macroscopic fat containing left adrenal adenoma, requiring no specific further follow-up or characterization. Kidneys are normal, without renal calculi, solid lesion, or hydronephrosis. Stomach/Bowel: Stomach is within normal limits. No evidence of bowel wall thickening, distention, or inflammatory changes. Vascular/Lymphatic: No significant vascular  findings are present. No enlarged abdominal lymph nodes. Other: No abdominal wall hernia or abnormality. No ascites. Musculoskeletal: No acute or significant osseous findings. IMPRESSION: 1. Hypoenhancing, diffusion restricting lesion of the superior liver dome, hepatic segment IVA, measuring 1.9 x 1.8 cm, consistent with metastatic disease. 2. Additional small diffusion restricting, hypoenhancing lesions in the anterior left lobe of the liver, hepatic segment III measuring 1.0 cm, and in the superior left lobe of the liver, hepatic segment II measuring 1.5 x 1.5 cm, consistent with additional metastases. 3.  A heterogeneously hyperenhancing lesion in the peripheral posterior right lobe of the liver, hepatic segment VI/VII measuring 1.5 x 1.3 cm, and a hyperenhancing lesion in the posterior liver dome, hepatic segment VII, measuring 0.7 cm are less confidently characterized as metastases given enhancement character, possibly incidentally present small focal nodular hyperplasias or hepatic adenomata, but also highly suspicious for small metastases. 4. Benign macroscopic fat containing left adrenal adenoma, requiring no specific further follow-up or characterization. 5. Cholelithiasis. Electronically Signed   By: Marolyn JONETTA Jaksch M.D.   On: 06/14/2024 08:48   CT Chest Wo Contrast Result Date: 06/14/2024 CLINICAL DATA:  Colon cancer diagnosis last week for staging. No treatment yet. EXAM: CT CHEST WITHOUT CONTRAST TECHNIQUE: Multidetector CT imaging of the chest was performed following the standard protocol without IV contrast. RADIATION DOSE REDUCTION: This exam was performed according to the departmental dose-optimization program which includes automated exposure control, adjustment of the mA and/or kV according to patient size and/or use of iterative reconstruction technique. COMPARISON:  CT abdomen/pelvis 06/07/2024 FINDINGS: Cardiovascular: Heart is normal in size. Tiny amount of pericardial fluid is present.  Thoracic aorta is normal in caliber. Pulmonary arterial system is unremarkable. Remaining vascular structures are normal. Mediastinum/Nodes: No mediastinal or hilar adenopathy. Remaining mediastinal structures are normal. 1.1 cm nodule along the inferior aspect of the thyroid  just right of midline Lungs/Pleura: Lungs are adequately inflated without acute airspace process or effusion. No significant pulmonary nodules or masses. Airways are normal. Upper Abdomen: Visualized images over the upper abdomen unchanged from recent prior exam with evidence of moderate cholelithiasis. Ill-defined 2.4 cm hypodensity over the dome of the left lobe of the liver unchanged and likely a metastatic focus. Stable 1.7 cm low-density left adrenal mass with Hounsfield unit measurements less than 10 likely compatible with an adenoma. No acute findings. Musculoskeletal: No focal abnormality. IMPRESSION: 1. No acute cardiopulmonary disease. No evidence of metastatic disease in the chest. 2. Stable 2.4 cm hypodensity over the left lobe of the liver suspicious for metastatic lesion. 3. Stable 1.7 cm low-density left adrenal mass with findings highly favoring an adenoma. 4. 1.1 cm nodule along the inferior aspect of the thyroid  just right of midline. No further imaging follow-up recommended. Electronically Signed   By: Toribio Agreste M.D.   On: 06/14/2024 08:26       I discussed the assessment and treatment plan with the patient. The patient was provided an opportunity to ask questions and all were answered. The patient agreed with the plan and demonstrated an understanding of the instructions.   The patient was advised to call back or seek an in-person evaluation if the symptoms worsen or if the condition fails to improve as anticipated.  I provided 25 minutes of non face-to-face telephone visit time during this encounter, including review of chart and various tests results, discussions about plan of care and coordination of care  plan.    Onita Mattock, MD 06/14/24

## 2024-06-15 ENCOUNTER — Encounter (HOSPITAL_COMMUNITY): Payer: Self-pay

## 2024-06-15 NOTE — Progress Notes (Signed)
 Ruth Mckinney POUR, MD  Ruth Mckinney Approved for US  guided biopsy of suspected colon cancer met to LIVER.  Posterior subcapsular in segment 6.  Mod sedation.  HKM       Previous Messages    ----- Message ----- From: Ruth Mckinney Sent: 06/14/2024   1:38 PM EST To: Ruth Mckinney; Ruth Mckinney Subject: US  liver bx                                    Procedure : US  biopsy liver  Reason : confirm metastasis Dx: Cancer of sigmoid colon (HCC) [C18.7 (ICD-10-CM)]    History :MR abdomen w/wo , CT chest w/o , CT abd pelv w  Provider :  Dr. Onita Mckinney  Contact : [phone]

## 2024-06-16 ENCOUNTER — Inpatient Hospital Stay: Admitting: Licensed Clinical Social Worker

## 2024-06-16 ENCOUNTER — Other Ambulatory Visit: Payer: Self-pay

## 2024-06-16 DIAGNOSIS — C187 Malignant neoplasm of sigmoid colon: Secondary | ICD-10-CM

## 2024-06-16 NOTE — Progress Notes (Signed)
 PATIENT NAVIGATOR PROGRESS NOTE  Name: Ruth Mckinney Date: 06/16/2024 MRN: [id number]  DOB: March 05, 1976   Reason for visit:  Phone call follow-up  Comments:   Orders entered for [facility] a cath insertion per Dr. Demetra request and scheduling message sent.  [facility] orders have also been entered. Called and updated patient on plan.  Educated patient on port a cath and informed her of [facility] testing.  Patient is agreeable to both.  Lab appointment for [facility] testing has been scheduled for 11/10.  Port a cath has been scheduled for 11/21.  Patient was advised to contact office with any questions or concerns.   Time spent counseling/coordinating care: 45-60 minutes

## 2024-06-16 NOTE — Progress Notes (Signed)
 [facility] Clinical Social Work  Initial Assessment   Ruth Mckinney is a 48 y.o. year old female contacted by phone. Clinical Social Work was referred by medical provider for assessment of psychosocial needs.   SDOH (Social Determinants of Health) assessments performed: Yes   SDOH Screenings   Food Insecurity: No Food Insecurity (06/07/2024)  Housing: Unknown (06/07/2024)  Transportation Needs: No Transportation Needs (06/07/2024)  Utilities: Not At Risk (06/07/2024)  Depression (PHQ2-9): Low Risk  (06/07/2024)  Tobacco Use: Medium Risk (06/07/2024)    PHQ 2/9:    06/07/2024    3:20 PM 06/07/2024    3:00 PM 06/22/2019    2:02 PM  Depression screen PHQ 2/9  Decreased Interest 1 0 0  Down, Depressed, Hopeless 0 0 0  PHQ - 2 Score 1 0 0     Distress Screen completed: No     No data to display            Family/Social Information:  Housing Arrangement: patient lives with her boyfriend, 72 y/o son and his girlfriend. Family members/support persons in your life? Pt's brother and mother are very involved.  Pt reports having good support. Transportation concerns: no  Employment: Working full time for [employer].  Pt's employer has been flexible. Pt has short term benefits that will start in January and long term benefits.  Pt has enough vacation and sick time to bridge until January if needed.   Income source: Employment Financial concerns: No Type of concern: None Food access concerns: no Religious or spiritual practice: Yes-[employer] Advanced directives: No Services Currently in place:  none  Coping/ Adjustment to diagnosis: Patient understands treatment plan and what happens next? yes Concerns about diagnosis and/or treatment: Overwhelmed by information, Afraid of cancer, and Quality of life Patient reported stressors: Anxiety/ nervousness and Adjusting to my illness Hopes and/or priorities: pt's priority is to start treatment w/ the hope of positive  results. Patient enjoys time with family/ friends Current coping skills/ strengths: Capable of independent living , Motivation for treatment/growth , Physical Health , and Supportive family/friends     SUMMARY: Current SDOH Barriers:  No barriers identified at this time.  Clinical Social Work Clinical Goal(s):  No clinical social work goals at this time  Interventions: Discussed common feeling and emotions when being diagnosed with cancer, and the importance of support during treatment Informed patient of the support team roles and support services at [facility] Provided CSW contact information and encouraged patient to call with any questions or concerns    Follow Up Plan: Patient will contact CSW with any support or resource needs Patient verbalizes understanding of plan: Yes    Ruth JONELLE Manna, LCSW Clinical Social Worker [facility]

## 2024-06-19 ENCOUNTER — Inpatient Hospital Stay

## 2024-06-19 DIAGNOSIS — C187 Malignant neoplasm of sigmoid colon: Secondary | ICD-10-CM

## 2024-06-19 LAB — MISCELLANEOUS TEST

## 2024-06-20 ENCOUNTER — Telehealth: Payer: Self-pay | Admitting: *Deleted

## 2024-06-20 NOTE — Progress Notes (Signed)
 PATIENT NAVIGATOR PROGRESS NOTE  Name: Ruth Mckinney Date: 06/20/2024 MRN: [id number]  DOB: 1975/08/11  Patient had called stating she had missed a call from the Cancer Center and wanted to know if I had tried to reach out to the patient.  Informed patient I could not see who had tried to reach out and that they would most likely try to call again if they did not leave a message.  Patient verbalized understanding.   Patient did have some questions regarding FMLA paperwork and also inquired on getting a disability parking placard for once she starts chemotherapy.  Patient was given the email address for the Patient Forms Team to submit her FMLA paperwork. Application for disability parking placard was started and emailed to patient for her to complete and submit.    Time spent counseling/coordinating care: 45-60 minutes

## 2024-06-20 NOTE — Telephone Encounter (Signed)
 Spoke with the patient regarding the referral to GYN oncology. Patient scheduled as new patient with Dr Viktoria on 12/5 at 9 am. Patient given an arrival time of 8:30 am.  Explained to the patient the the doctor will perform a pelvic exam at this visit. Patient given the policy that only one visitor allowed and that visitor must be over 16 yrs are allowed in the Cancer Center. Patient given the address/phone number for the clinic and that the center offers free valet service. Patient aware that masks optional.

## 2024-06-20 NOTE — Progress Notes (Signed)
 REFERRING PHYSICIAN:  Kriss Stagger, DO  PROVIDER:  BERNARDA WANDA NED, MD  MRN: [id number] DOB: February 10, 1976 DATE OF ENCOUNTER: 06/20/2024  Subjective   Chief Complaint: NEW CANCER     History of Present Illness: Ruth Mckinney is a 48 y.o. female who is seen today as an office consultation at the request of Dr. Kriss for evaluation of NEW CANCER .  48 year old female who presents to the office with a large partially obstructed sigmoid colon mass with possible metastatic disease to the liver.  She states that she has been having abdominal pain and weight loss for the last year.  She attributed this to dietary changes at first but then her doctor recommended colonoscopy.  This revealed a likely malignant partially obstructive tumor in the sigmoid colon which was biopsied and tattooed.  The remainder of the colon was normal except for diverticulosis and 1 other polyp in the sigmoid colon.  Pathology significant for invasive poorly differentiated adenocarcinoma and the other polyp was noted to be a sessile serrated adenoma.   She has been seen by oncology and MRI of the liver shows probable metastatic disease.  She will be undergoing liver biopsy and port placement by interventional radiology next week.  She will then be starting chemotherapy.  She is here today to discuss surgery.  She reports that her abdominal cramping and constipation are better with twice daily dosing of MiraLAX.   Review of Systems: A complete review of systems was obtained from the patient.  I have reviewed this information and discussed as appropriate with the patient.  See HPI as well for other ROS.    Medical History: Past Medical History:  Diagnosis Date  . Diabetes mellitus without complication (CMS/HHS-HCC)   . GERD (gastroesophageal reflux disease)   . History of cancer   . Hyperlipidemia     There is no problem list on file for this patient.   Past Surgical History:  Procedure  Laterality Date  . ADENOIDECTOMY    . CERVICAL BIOPSY  W/ LOOP ELECTRODE EXCISION    . CESAREAN SECTION    . DILATION AND CURETTAGE, DIAGNOSTIC / THERAPEUTIC       Allergies  Allergen Reactions  . Ibuprofen Nausea and Other (See Comments)    Abdominal pains.    Current Outpatient Medications on File Prior to Visit  Medication Sig Dispense Refill  . atorvastatin (LIPITOR) 20 MG tablet     . DEPO-PROVERA  150 mg/mL IM syringe     . OZEMPIC  2 mg/dose (8 mg/3 mL) pen injector Inject 2 mg subcutaneously every 7 (seven) days    . polyethylene glycol (MIRALAX) powder Take 17 g by mouth once daily Mix in 4-8ounces of fluid prior to taking.    . TRESIBA  FLEXTOUCH U-100 pen injector (concentration 100 units/mL)      No current facility-administered medications on file prior to visit.    Family History  Problem Relation Age of Onset  . Obesity Mother   . High blood pressure (Hypertension) Mother   . Diabetes Mother   . High blood pressure (Hypertension) Father      Social History   Tobacco Use  Smoking Status Former  . Types: Cigarettes  . Start date: 2020  Smokeless Tobacco Never     Social History   Socioeconomic History  . Marital status: Single  Tobacco Use  . Smoking status: Former    Types: Cigarettes    Start date: 2020  . Smokeless tobacco: Never  Substance and  Sexual Activity  . Alcohol use: Yes    Alcohol/week: 0.0 - 1.0 standard drinks of alcohol  . Drug use: Never   Social Drivers of Health   Food Insecurity: No Food Insecurity (06/07/2024)   Received from [facility]   Hunger Vital Sign   . Within the past 12 months, you worried that your food would run out before you got the money to buy more.: Never true   . Within the past 12 months, the food you bought just didn't last and you didn't have money to get more.: Never true  Transportation Needs: No Transportation Needs (06/07/2024)   Received from [facility] - Transportation   . In the past  12 months, has lack of transportation kept you from medical appointments or from getting medications?: No   . In the past 12 months, has lack of transportation kept you from meetings, work, or from getting things needed for daily living?: No  Housing Stability: Unknown (06/20/2024)   Housing Stability Vital Sign   . Homeless in the Last Year: No    Objective:    Vitals:   06/20/24 0923 06/20/24 0924  BP: (!) 140/88   Pulse: 97   Temp: 36.5 C (97.7 F)   SpO2: 97%   Weight: 75.1 kg (165 lb 9.6 oz)   Height: 177.8 cm (5' 10)   PainLoc:  Abdomen     Exam Gen: NAD Abd: Soft, nondistended    Labs, Imaging and Diagnostic Testing: CEA 10.7  Assessment and Plan:  Cancer of sigmoid colon (CMS/HHS-HCC)  (primary encounter diagnosis)  Patient's symptoms of constipation and obstruction seem to have improved with twice daily MiraLAX.  She will continue this for now.  We did discuss the possible need for diverting ostomy if her symptoms worsen.  Surgical resection could be completed if she has good control of her disease with the chemotherapy.  I am concerned for uterine involvement and possible left ovary involvement.  I will make a referral to Dr. Viktoria to evaluate the need for possible hysterectomy or oophorectomy as an en bloc resection.   Return if symptoms worsen or fail to improve.    [name], MD Colon and Rectal Surgery [facility]

## 2024-06-21 ENCOUNTER — Other Ambulatory Visit: Payer: Self-pay | Admitting: Hematology

## 2024-06-21 ENCOUNTER — Telehealth: Payer: Self-pay

## 2024-06-21 ENCOUNTER — Inpatient Hospital Stay (HOSPITAL_BASED_OUTPATIENT_CLINIC_OR_DEPARTMENT_OTHER): Admitting: Hematology

## 2024-06-21 DIAGNOSIS — C187 Malignant neoplasm of sigmoid colon: Secondary | ICD-10-CM | POA: Diagnosis not present

## 2024-06-21 DIAGNOSIS — Z1379 Encounter for other screening for genetic and chromosomal anomalies: Secondary | ICD-10-CM | POA: Insufficient documentation

## 2024-06-21 MED ORDER — DEXAMETHASONE 4 MG PO TABS
4.0000 mg | ORAL_TABLET | Freq: Every day | ORAL | 1 refills | Status: DC
Start: 2024-06-21 — End: 2024-07-03

## 2024-06-21 MED ORDER — ONDANSETRON HCL 8 MG PO TABS: 8.0000 mg | ORAL_TABLET | Freq: Three times a day (TID) | ORAL | 1 refills | Status: DC | PRN

## 2024-06-21 MED ORDER — LOPERAMIDE HCL 2 MG PO CAPS: ORAL_CAPSULE | ORAL | 3 refills | Status: DC

## 2024-06-21 MED ORDER — LIDOCAINE-PRILOCAINE 2.5-2.5 % EX CREA
TOPICAL_CREAM | CUTANEOUS | 3 refills | Status: DC
Start: 2024-06-21 — End: 2024-07-03

## 2024-06-21 MED ORDER — PROCHLORPERAZINE MALEATE 10 MG PO TABS: 10.0000 mg | ORAL_TABLET | Freq: Four times a day (QID) | ORAL | 1 refills | Status: DC | PRN

## 2024-06-21 NOTE — Progress Notes (Signed)
 START ON PATHWAY REGIMEN - Colorectal     A cycle is every 14 days:     Bevacizumab-xxxx      Irinotecan      Oxaliplatin      Leucovorin      Fluorouracil   **Always confirm dose/schedule in your pharmacy ordering system**  Patient Characteristics: Distant Metastases, Nonsurgical Candidate, Non-KRAS G12C, RAS Mutation Positive/Unknown (BRAF V600 Wild-Type/Unknown), Standard Cytotoxic Therapy, First Line Standard Cytotoxic Therapy, Bevacizumab Eligible, PS = 0,1 Tumor Location: Rectal Therapeutic Status: Distant Metastases Microsatellite/Mismatch Repair Status: MSS/pMMR BRAF Mutation Status: Awaiting Test Results KRAS/NRAS Mutation Status: Awaiting Test Results Preferred Therapy Approach: Standard Cytotoxic Therapy Standard Cytotoxic Line of Therapy: First Line Standard Cytotoxic Therapy ECOG Performance Status: 0 Bevacizumab Eligibility: Eligible Intent of Therapy: Non-Curative / Palliative Intent, Discussed with Patient

## 2024-06-21 NOTE — Assessment & Plan Note (Signed)
 Probable stage IV with liver metastasis, MMR normal -Diagnosed in October 2025.  Patient presented with constipation for 4 to 5 months. -Colonoscopy on June 05, 2024 showed a fungating and infiltrative partially obstructing large mass in the sigmoid colon, measuring 10 cm, involving one half of the lumen.  Biopsy confirmed poorly differentiated adenocarcinoma.  MMR normal. -Liver MRI showed 1.9 cm lesion in the duodenum, and 2 additional 1.0 to 1.5 cm mass in the left lobe of liver, concerning for metastasis.

## 2024-06-21 NOTE — Progress Notes (Signed)
 [facility]   [phone] [phone]) [phone]   Clinic Follow up Note   Patient Care Team: Dwight Trula SQUIBB, MD as PCP - General (Internal Medicine) Ardis Evalene CROME, RN as Oncology Nurse Navigator 06/21/2024  I connected with Ruth Mckinney on 06/21/24 at  3:40 PM EST by telephone and verified that I am speaking with the correct person using two identifiers.   I discussed the limitations, risks, security and privacy concerns of performing an evaluation and management service by telephone and the availability of in person appointments. I also discussed with the patient that there may be a patient responsible charge related to this service. The patient expressed understanding and agreed to proceed.   Patient's location:  Work  Provider's location:  Office    CHIEF COMPLAINT: f/u sigmoid rectal cancer   CURRENT THERAPY: Pending chemotherapy  Oncology history Cancer of sigmoid colon (HCC) Probable stage IV with liver metastasis, MMR normal -Diagnosed in October 2025.  Patient presented with constipation for 4 to 5 months. -Colonoscopy on June 05, 2024 showed a fungating and infiltrative partially obstructing large mass in the sigmoid colon, measuring 10 cm, involving one half of the lumen.  Biopsy confirmed poorly differentiated adenocarcinoma.  MMR normal. -Liver MRI showed 1.9 cm lesion in the duodenum, and 2 additional 1.0 to 1.5 cm mass in the left lobe of liver, concerning for metastasis.  Assessment & Plan Rectosigmoid cancer with possible liver metastasis Sigmoid cancer with potential liver metastasis. Awaiting liver biopsy results to confirm metastatic disease. If confirmed, treatment aims for remission or long-term disease control. Chemotherapy is planned to potentially enable surgery or liver-directed therapy. The chemotherapy regimen is intensive, involving three drugs: oxaliplatin, irinotecan, and 5FU via a pump. The treatment is every two  weeks, with a growth factor shot post-pump removal to boost white blood cell count. The duration of chemotherapy is estimated at three to six months, depending on response and surgical plans. A repeat MRI of the liver will be conducted after three months of chemotherapy to assess response and determine surgical options. - Scheduled port placement and liver biopsy for November 21st. - Will initiate chemotherapy with oxaliplatin, irinotecan, and 5FU via pump every two weeks. - Will administer growth factor shot post-pump removal. - Will conduct chemotherapy class to educate on treatment regimen. - Will prescribe steroid for post-chemotherapy symptoms. - Will prescribe two nausea medications for use as needed. - Will apply lidocaine cream to port site for numbing during lab draws. - Will repeat MRI of liver after three months of chemotherapy to assess response. - Will discuss potential liver-directed therapy or surgery based on biopsy results and response to chemotherapy.  Constipation Managed with Miralax twice daily, resulting in multiple bowel movements per day and relief of symptoms. - Continue Miralax twice daily.  Plan - I recommend first-line chemotherapy FOLFIRINOX with G-CSF on day 3, starting after her port and a liver biopsy.  Will schedule chemo class. - Follow-up with the first cycle chemo. - I called in antiemetics, Emla cream and dexamethasone.   SUMMARY OF ONCOLOGIC HISTORY: Oncology History  Cancer of sigmoid colon (HCC)  06/05/2024 Cancer Staging   Staging form: Colon and Rectum, AJCC 8th Edition - Clinical stage from 06/05/2024: Stage Unknown (cTX, cN0, cM1) - Signed by Lanny Callander, MD on 06/21/2024 Stage prefix: Initial diagnosis Total positive nodes: 0 Histologic grade (G): G2 Histologic grading system: 4 grade system   06/06/2024 Initial Diagnosis   Cancer of sigmoid colon (HCC)  06/20/2024 Genetic Testing   Negative genetic testing. Report date is  06/20/2024.  The CancerNext-Expanded gene panel offered by [facility] and includes sequencing, rearrangement, and RNA analysis for the following 77 genes: AIP, ALK, APC, ATM, BAP1, BARD1, BMPR1A, BRCA1, BRCA2, BRIP1, CDC73, CDH1, CDK4, CDKN1B, CDKN2A, CEBPA, CHEK2, CTNNA1, DDX41, DICER1, ETV6, FH, FLCN, GATA2, LZTR1, MAX, MBD4, MEN1, MET, MLH1, MSH2, MSH3, MSH6, MUTYH, NF1, NF2, NTHL1, PALB2, PHOX2B, PMS2, POT1, PRKAR1A, PTCH1, PTEN, RAD51C, RAD51D, RB1, RET, RPS20, RUNX1, SDHA, SDHAF2, SDHB, SDHC, SDHD, SMAD4, SMARCA4, SMARCB1, SMARCE1, STK11, SUFU, TMEM127, TP53, TSC1, TSC2, VHL, and WT1 (sequencing and deletion/duplication); AXIN2, CTNNA1, DDX41, EGFR, HOXB13, KIT, MBD4, MITF, MSH3, PDGFRA, POLD1 and POLE (sequencing only); EPCAM and GREM1 (deletion/duplication only). RNA data is routinely analyzed for use in variant interpretation for all genes.    07/05/2024 -  Chemotherapy   Patient is on Treatment Plan : COLORECTAL FOLFIRINOX + Bevacizumab q14d       Discussed the use of AI scribe software for clinical note transcription with the patient, who gave verbal consent to proceed.  History of Present Illness Ruth Mckinney is a 48 year old female with rectal cancer who presents for follow-up.  She uses Miralax twice daily, which effectively increases bowel movements. She experiences frequent bowel movements multiple times a day.     REVIEW OF SYSTEMS:   Constitutional: Denies fevers, chills or abnormal weight loss Eyes: Denies blurriness of vision Ears, nose, mouth, throat, and face: Denies mucositis or sore throat Respiratory: Denies cough, dyspnea or wheezes Cardiovascular: Denies palpitation, chest discomfort or lower extremity swelling Gastrointestinal:  Denies nausea, heartburn or change in bowel habits Skin: Denies abnormal skin rashes Lymphatics: Denies new lymphadenopathy or easy bruising Neurological:Denies numbness, tingling or new weaknesses Behavioral/Psych: Mood is  stable, no new changes  All other systems were reviewed with the patient and are negative.  MEDICAL HISTORY:  Past Medical History:  Diagnosis Date   Abnormal Pap smear    Anemia    Diabetes mellitus (HCC)    Family history of thyroid  cancer    Mother at 59   Genital herpes     SURGICAL HISTORY: Past Surgical History:  Procedure Laterality Date   ADENOIDECTOMY     CESAREAN SECTION     COLPOSCOPY     LEEP     MOUTH SURGERY      I have reviewed the social history and family history with the patient and they are unchanged from previous note.  ALLERGIES:  is allergic to ibuprofen.  MEDICATIONS:  Current Outpatient Medications  Medication Sig Dispense Refill   Blood Glucose Monitoring Suppl (BAYER CONTOUR LINK 2.4) w/Device KIT Use Bayer Contour link meter to check blood sugar 2 times a day 1 kit 0   dexamethasone (DECADRON) 4 MG tablet Take 1 tablet (4 mg total) by mouth daily. Take 1 tablets daily x 3 days starting the day after chemotherapy. Take with food. 30 tablet 1   glucose blood test strip Use Bayer Contour test strips to check blood sugar 2 times a day 200 each 2   insulin  degludec (TRESIBA  FLEXTOUCH) 200 UNIT/ML FlexTouch Pen Inject 36 Units into the skin daily. 9 mL 3   Insulin  Pen Needle 32G X 4 MM MISC Use 1x a day 100 each 3   lidocaine-prilocaine (EMLA) cream Apply to affected area once 30 g 3   loperamide (IMODIUM) 2 MG capsule Take 2 tabs by mouth with first loose stool, then 1 tab with each additional loose  stool as needed. Do not exceed 8 tabs in a 24-hour period 60 capsule 3   medroxyPROGESTERone  (DEPO-PROVERA ) 150 MG/ML injection Inject 150 mg into the muscle every 3 (three) months. Reported on 11/06/2015     Microlet Lancets MISC Use to check blood sugar 2 times a day 200 each 2   ondansetron (ZOFRAN) 8 MG tablet Take 1 tablet (8 mg total) by mouth every 8 (eight) hours as needed for nausea or vomiting. Start on the third day after chemotherapy 30 tablet 1    prochlorperazine (COMPAZINE) 10 MG tablet Take 1 tablet (10 mg total) by mouth every 6 (six) hours as needed for nausea or vomiting (Nausea or vomiting). 30 tablet 1   Semaglutide , 1 MG/DOSE, (OZEMPIC , 1 MG/DOSE,) 4 MG/3ML SOPN Inject 1 mg into the skin once a week. 9 mL 2   valACYclovir  (VALTREX ) 500 MG tablet Take 500 mg by mouth 2 (two) times daily. For 7 days as needed     No current facility-administered medications for this visit.    PHYSICAL EXAMINATION: Not performed   LABORATORY DATA:  I have reviewed the data as listed    Latest Ref Rng & Units 06/07/2024    4:27 PM 04/25/2019   12:44 PM 08/28/2011    5:00 PM  CBC  WBC 4.0 - 10.5 K/uL 5.3  4.4  7.7   Hemoglobin 12.0 - 15.0 g/dL 86.7  85.6  85.2   Hematocrit 36.0 - 46.0 % 40.1  44.2  42.9   Platelets 150 - 400 K/uL 225  272  345         Latest Ref Rng & Units 06/07/2024    4:27 PM 05/17/2020    9:25 AM 06/22/2019    3:39 PM  CMP  Glucose 70 - 99 mg/dL 880  785  841   BUN 6 - 20 mg/dL 5  10  11    Creatinine 0.44 - 1.00 mg/dL 9.23  9.14  9.14   Sodium 135 - 145 mmol/L 140  137  139   Potassium 3.5 - 5.1 mmol/L 4.3  4.2  4.1   Chloride 98 - 111 mmol/L 105  105  104   CO2 22 - 32 mmol/L 24  25  20    Calcium 8.9 - 10.3 mg/dL 88.5  89.5  89.4   Total Protein 6.5 - 8.1 g/dL 7.7  7.0  6.9   Total Bilirubin 0.0 - 1.2 mg/dL 0.4  0.3  0.2   Alkaline Phos 38 - 126 U/L 129  94  118   AST 15 - 41 U/L 19  11  14    ALT 0 - 44 U/L 8  20  24        RADIOGRAPHIC STUDIES: I have personally reviewed the radiological images as listed and agreed with the findings in the report. No results found.     I discussed the assessment and treatment plan with the patient. The patient was provided an opportunity to ask questions and all were answered. The patient agreed with the plan and demonstrated an understanding of the instructions.   The patient was advised to call back or seek an in-person evaluation if the symptoms worsen or if  the condition fails to improve as anticipated.  I provided 40 minutes of non face-to-face telephone visit time during this encounter, including review of chart and various tests results, discussions about plan of care and coordination of care plan.    Onita Mattock, MD 06/21/24

## 2024-06-21 NOTE — Telephone Encounter (Signed)
 I contacted  Ms. Buczek to discuss her genetic testing results. No pathogenic variants were identified in the 77 genes analyzed. Discussed that we do not know why she has colon cancer. It could be due to a different gene that we are not testing, or maybe our current technology may not be able to pick something up.  It will be important for her to keep in contact with genetics to keep up with whether additional testing may be needed. Detailed clinic note to follow.   The test report will be scanned into EPIC and will be located under the Molecular Pathology section of the Results Review tab.  A portion of the result report is included below for reference.

## 2024-06-22 ENCOUNTER — Other Ambulatory Visit: Payer: Self-pay

## 2024-06-22 ENCOUNTER — Telehealth: Payer: Self-pay | Admitting: Hematology

## 2024-06-22 NOTE — Telephone Encounter (Signed)
 Pt is scheduled and aware of her appts and time and dates.

## 2024-06-23 ENCOUNTER — Other Ambulatory Visit: Payer: Self-pay

## 2024-06-25 ENCOUNTER — Other Ambulatory Visit: Payer: Self-pay

## 2024-06-27 ENCOUNTER — Inpatient Hospital Stay

## 2024-06-27 ENCOUNTER — Other Ambulatory Visit: Payer: Self-pay | Admitting: Radiology

## 2024-06-27 DIAGNOSIS — K769 Liver disease, unspecified: Secondary | ICD-10-CM

## 2024-06-28 ENCOUNTER — Other Ambulatory Visit (HOSPITAL_COMMUNITY): Payer: Self-pay | Admitting: Student

## 2024-06-29 ENCOUNTER — Other Ambulatory Visit: Payer: Self-pay | Admitting: Radiology

## 2024-06-29 NOTE — H&P (Signed)
 Chief Complaint:  Sigmoid Colon Cancer with concern for Metastases  Procedure: Liver Biopsy and Port-a-catheter insertion  Referring Provider(s): Dr. Onita Mattock  Supervising Physician: Luverne Aran  Patient Status: [facility] - Out-pt  History of Present Illness: Ruth Mckinney is a 48 y.o. female with a history of diabetes and recent diagnosis of sigmoid colon adenocarcinoma with liver lesions. Per chart, patient had been experiencing worsening GI symptoms, including intermittent constipation and diarrhea, abdominal cramps, dyspareunia, and hematochezia with blood clots in her stool. She underwent colonoscopy on 10/27 which revealed a partially obstructing tumor in the sigmoid colon. Pathology revealed invasive poorly differentiated adenocarcinoma and patient was subsequently referred to Dr. Mattock for management and treatment. Follow up imaging revealed hypoenhancing lesion of the liver concerning for metastasis. She has plans to undergo chemotherapy with possible surgery or liver directed therapy. She presents today for her liver lesion biopsy and port-a-catheter insertion.   Patient is currently resting in bed with her mom at the bedside. States that she continues to experience severe fatigue and have intermittent abdominal pain and constipation that is well controlled with Miralax. Has had large amounts of blood in stool previously, but states this is not as heavy as it used to be. Currently only notices blood intermittently with wiping. Reports >60lb unintentional weight loss in the last year. Denies any fevers/chills, chest pain, palpitations, or shortness of breath. NPO since midnight. All questions and concerns answered at the bedside.   Patient is Full Code  Past Medical History:  Diagnosis Date   Abnormal Pap smear    Anemia    Diabetes mellitus (HCC)    Family history of thyroid  cancer    Mother at 64   Genital herpes     Past Surgical History:  Procedure Laterality Date    ADENOIDECTOMY     CESAREAN SECTION     COLPOSCOPY     LEEP     MOUTH SURGERY      Allergies: Ibuprofen  Medications: Prior to Admission medications   Medication Sig Start Date End Date Taking? Authorizing Provider  Blood Glucose Monitoring Suppl (BAYER CONTOUR LINK 2.4) w/Device KIT Use Bayer Contour link meter to check blood sugar 2 times a day 03/07/20   Trixie File, MD  dexamethasone  (DECADRON ) 4 MG tablet Take 1 tablet (4 mg total) by mouth daily. Take 1 tablets daily x 3 days starting the day after chemotherapy. Take with food. 06/21/24   Mattock Onita, MD  glucose blood test strip Use Bayer Contour test strips to check blood sugar 2 times a day 03/07/20   Trixie File, MD  insulin  degludec (TRESIBA  FLEXTOUCH) 200 UNIT/ML FlexTouch Pen Inject 36 Units into the skin daily. 05/17/20   Trixie File, MD  Insulin  Pen Needle 32G X 4 MM MISC Use 1x a day 02/08/20   Trixie File, MD  lidocaine -prilocaine  (EMLA ) cream Apply to affected area once 06/21/24   Mattock Onita, MD  loperamide  (IMODIUM ) 2 MG capsule Take 2 tabs by mouth with first loose stool, then 1 tab with each additional loose stool as needed. Do not exceed 8 tabs in a 24-hour period 06/21/24   Mattock Onita, MD  medroxyPROGESTERone  (DEPO-PROVERA ) 150 MG/ML injection Inject 150 mg into the muscle every 3 (three) months. Reported on 11/06/2015    [provider]  Microlet Lancets MISC Use to check blood sugar 2 times a day 03/07/20   Trixie File, MD  ondansetron  (ZOFRAN ) 8 MG tablet Take 1 tablet (8 mg total) by  mouth every 8 (eight) hours as needed for nausea or vomiting. Start on the third day after chemotherapy 06/21/24   Lanny Callander, MD  prochlorperazine  (COMPAZINE ) 10 MG tablet Take 1 tablet (10 mg total) by mouth every 6 (six) hours as needed for nausea or vomiting (Nausea or vomiting). 06/21/24   Lanny Callander, MD  Semaglutide , 1 MG/DOSE, (OZEMPIC , 1 MG/DOSE,) 4 MG/3ML SOPN Inject 1 mg into the skin once a week.  05/17/20   Trixie File, MD  valACYclovir  (VALTREX ) 500 MG tablet Take 500 mg by mouth 2 (two) times daily. For 7 days as needed    [provider]     Family History  Problem Relation Age of Onset   Hypertension Mother    Diabetes Mother    Anemia Mother    Migraines Mother    Thyroid  cancer Mother 40   Hypertension Maternal Grandmother    Diabetes Maternal Grandmother    Heart disease Maternal Grandfather    Diabetes Maternal Grandfather    Colon cancer Maternal Great-grandfather    Anesthesia problems Neg Hx    BRCA 1/2 Neg Hx    Breast cancer Neg Hx     Social History   Socioeconomic History   Marital status: Single    Spouse name: Not on file   Number of children: 2   Years of education: Not on file   Highest education level: Not on file  Occupational History   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    Social Drivers of Corporate Investment Banker Strain: Not on file  Food Insecurity: No Food Insecurity (06/07/2024)   Hunger Vital Sign    Worried About Running Out of Food in the Last Year: Never true    Ran Out of Food in the Last Year: Never true  Transportation Needs: No Transportation Needs (06/07/2024)   PRAPARE - Administrator, Civil Service (Medical): No    Lack of Transportation (Non-Medical): No  Physical Activity: Not on file  Stress: Not on file  Social Connections: Not on file    Review of Systems   Constitutional:  Positive for fatigue and unexpected weight change.  Gastrointestinal:  Positive for abdominal pain and blood in stool (intermittent).  Patient denies any headache, chest pain, shortness of breath, N/V, or fever/chills. All other systems are negative.   Vital Signs:    06/30/2024    9:48 AM 06/07/2024    3:00 PM 09/27/2021    5:06 AM  Vitals with BMI  Height 5' 7 5' 7   Weight 164 lbs 10 oz 165 lbs 8 oz   BMI 25.77 25.91   Systolic 138 122 868  Diastolic 87 76 90  Pulse 91 108 95      Physical Exam  Imaging: MR Abdomen W Wo Contrast Result Date: 06/14/2024 CLINICAL DATA:  Characterize liver lesion, colon cancer, metastatic disease suspected EXAM: MRI ABDOMEN WITHOUT AND WITH CONTRAST TECHNIQUE: Multiplanar multisequence MR imaging of the abdomen was performed both before and after the administration of intravenous contrast. CONTRAST:  7mL GADAVIST  GADOBUTROL  1 MMOL/ML IV SOLN COMPARISON:  06/07/2024 FINDINGS: Lower chest: No acute abnormality. Hepatobiliary: Hypoenhancing, diffusion restricting lesion of the superior liver dome, hepatic segment IVA, measuring 1.9 x 1.8 cm (series 12, image 18, series 7, image 7). Additional small diffusion restricting lesions scattered throughout the liver with variable enhancement, including a hypoenhancing lesion in the anterior left lobe of the liver, hepatic segment III measuring 1.0 cm (series 7, image 14), the superior left lobe of the liver, hepatic segment II, measuring 1.5 x 1.5 cm (series 7, image 12), a heterogeneously hyperenhancing lesion in the peripheral posterior right lobe of the liver, hepatic segment VI/VII measuring 1.5 x 1.3 cm (series 7, image 17), and a hyperenhancing lesion in the posterior liver dome, hepatic segment VII, measuring 0.7 cm (series 12, image 18). Small gallstones. No gallbladder wall thickening. No biliary ductal dilatation. Pancreas: Unremarkable. No pancreatic ductal dilatation or surrounding  inflammatory changes. Spleen: Normal in size without significant abnormality. Adrenals/Urinary Tract: Benign macroscopic fat containing left adrenal adenoma, requiring no specific further follow-up or characterization. Kidneys are normal, without renal calculi, solid lesion, or hydronephrosis. Stomach/Bowel: Stomach is within normal limits. No evidence of bowel wall thickening, distention, or inflammatory changes. Vascular/Lymphatic: No significant vascular findings are present. No enlarged abdominal lymph nodes. Other: No abdominal wall hernia or abnormality. No ascites. Musculoskeletal: No acute or significant osseous findings. IMPRESSION: 1. Hypoenhancing, diffusion restricting lesion of the superior liver dome, hepatic segment IVA, measuring 1.9 x 1.8 cm, consistent with metastatic disease. 2. Additional small diffusion restricting, hypoenhancing lesions in the anterior left lobe of the liver, hepatic segment III measuring 1.0 cm, and in the superior left lobe of the liver, hepatic segment II measuring 1.5 x 1.5 cm, consistent with additional metastases. 3. A heterogeneously hyperenhancing lesion in the peripheral posterior right lobe of the liver, hepatic segment VI/VII measuring 1.5 x 1.3 cm, and a hyperenhancing lesion in the posterior liver dome, hepatic segment VII, measuring 0.7 cm are less confidently characterized as metastases given enhancement character, possibly incidentally present small focal nodular hyperplasias or hepatic adenomata, but also highly suspicious for small metastases. 4. Benign macroscopic fat containing left adrenal adenoma, requiring no specific further follow-up or characterization. 5. Cholelithiasis. Electronically Signed   By: Marolyn JONETTA Jaksch M.D.   On: 06/14/2024 08:48   CT Chest Wo Contrast Result Date: 06/14/2024 CLINICAL DATA:  Colon cancer diagnosis last week for staging. No treatment yet. EXAM: CT CHEST WITHOUT CONTRAST TECHNIQUE: Multidetector CT imaging of the chest was  performed following the standard protocol without IV contrast. RADIATION DOSE REDUCTION: This exam was performed according to the departmental dose-optimization program which includes automated exposure control, adjustment of the mA and/or kV according to patient size and/or use of iterative reconstruction technique. COMPARISON:  CT abdomen/pelvis 06/07/2024 FINDINGS: Cardiovascular: Heart is normal in size. Tiny amount of pericardial fluid is present. Thoracic aorta is normal in caliber. Pulmonary arterial system is unremarkable. Remaining vascular structures are normal. Mediastinum/Nodes: No mediastinal or hilar adenopathy. Remaining mediastinal structures are normal. 1.1 cm nodule along the inferior aspect of the thyroid  just right of midline Lungs/Pleura: Lungs are adequately inflated without acute airspace process or effusion. No significant pulmonary nodules or masses. Airways are normal. Upper Abdomen: Visualized images over the upper abdomen unchanged from  recent prior exam with evidence of moderate cholelithiasis. Ill-defined 2.4 cm hypodensity over the dome of the left lobe of the liver unchanged and likely a metastatic focus. Stable 1.7 cm low-density left adrenal mass with Hounsfield unit measurements less than 10 likely compatible with an adenoma. No acute findings. Musculoskeletal: No focal abnormality. IMPRESSION: 1. No acute cardiopulmonary disease. No evidence of metastatic disease in the chest. 2. Stable 2.4 cm hypodensity over the left lobe of the liver suspicious for metastatic lesion. 3. Stable 1.7 cm low-density left adrenal mass with findings highly favoring an adenoma. 4. 1.1 cm nodule along the inferior aspect of the thyroid  just right of midline. No further imaging follow-up recommended. Electronically Signed   By: Toribio Agreste M.D.   On: 06/14/2024 08:26   CT ABDOMEN PELVIS W CONTRAST Result Date: 06/07/2024 CLINICAL DATA:  Status post recent colonoscopy. Neoplasm of digestive system.  EXAM: CT ABDOMEN AND PELVIS WITH CONTRAST TECHNIQUE: Multidetector CT imaging of the abdomen and pelvis was performed using the standard protocol following bolus administration of intravenous contrast. RADIATION DOSE REDUCTION: This exam was performed according to the departmental dose-optimization program which includes automated exposure control, adjustment of the mA and/or kV according to patient size and/or use of iterative reconstruction technique. CONTRAST:  100mL ISOVUE -300 IOPAMIDOL  (ISOVUE -300) INJECTION 61% COMPARISON:  None Available. FINDINGS: Lower chest: The visualized lung bases are clear. No intra-abdominal free air or free fluid. Hepatobiliary: There is a 2 x 2 cm hypoenhancing lesion in the dome of the liver suspicious for metastatic disease. No biliary dilatation. Multiple gallstones. No pericholecystic fluid or evidence of acute cholecystitis by CT. Pancreas: Unremarkable. No pancreatic ductal dilatation or surrounding inflammatory changes. Spleen: Normal in size without focal abnormality. Adrenals/Urinary Tract: The right adrenal glands unremarkable. There is a 2 cm left adrenal nodule which is not characterized on this CT. Metastatic disease is not excluded. There is a 4 cm right renal inferior pole cyst. There is no hydronephrosis on either side. The visualized ureters and urinary bladder appear unremarkable. Stomach/Bowel: There is segmental thickening of the sigmoid colon involving a proximally 8 cm long segment of the sigmoid colon. There is a 6 x 6 cm exophytic mass in the pelvis which appears to arise from the sigmoid colon in keeping with provided history of malignancy. There is distal colonic diverticulosis. There is no bowel obstruction or active inflammation. The appendix is normal. Vascular/Lymphatic: The abdominal aorta and IVC are unremarkable. No portal venous gas. There is no adenopathy. Reproductive: The uterus is anteverted. No suspicious adnexal masses. Other: None  Musculoskeletal: No acute or significant osseous findings. IMPRESSION: 1. Sigmoid colon mass in keeping with provided history of malignancy. 2. A 2 cm hypoenhancing lesion in the dome of the liver suspicious for metastatic disease. 3. A 2 cm left adrenal nodule is not characterized on this CT. Metastatic disease is not excluded. MRI without and with contrast may provide better evaluation of the left adrenal and hepatic lesions. 4. Cholelithiasis. Electronically Signed   By: Vanetta Chou M.D.   On: 06/07/2024 13:57    Labs:  CBC: Recent Labs    06/07/24 1627 06/30/24 0946  WBC 5.3 5.4  HGB 13.2 12.0  HCT 40.1 36.7  PLT 225 306    COAGS: Recent Labs    06/30/24 0946  INR 0.9    BMP: Recent Labs    06/07/24 1627  NA 140  K 4.3  CL 105  CO2 24  GLUCOSE 119*  BUN <5*  CALCIUM  11.4*  CREATININE 0.76  GFRNONAA >60    LIVER FUNCTION TESTS: Recent Labs    06/07/24 1627  BILITOT 0.4  AST 19  ALT 8  ALKPHOS 129*  PROT 7.7  ALBUMIN 4.4    TUMOR MARKERS: Recent Labs    06/07/24 1627  CEA 10.71*    Assessment and Plan:  Adenocarcinoma of the Sigmoid Colon: Ruth Mckinney is a 48 y.o. female with a history of Diabetes and recent diagnosis of adenocarcinoma of the sigmoid colon. Follow up imaging also with concerns for possible metastasis to the liver. She presents to [facility] Interventional Radiology department for an image-guided liver lesion biopsy and port-a-catheter insertion with Dr. KANDICE Moan. Procedure to be performed under moderate sedation.  Risks and benefits of image guided port-a-catheter placement was discussed with the patient including, but not limited to bleeding, infection, pneumothorax, or fibrin sheath development and need for additional procedures.  Risks and benefits of liver lesion biopsy was discussed with the patient and/or patient's family including, but not limited to bleeding, infection, damage to adjacent structures or low yield  requiring additional tests.  All of the questions were answered and there is agreement to proceed.  Consent signed and in chart.   Thank you for allowing our service to participate in Ruth Mckinney 's care.    Electronically Signed: Edynn Gillock M Marnie Fazzino, PA-C   06/30/2024, 10:50 AM     I spent a total of 40 Minutes in face to face in clinical consultation, greater than 50% of which was counseling/coordinating care for liver biopsy and port a catheter insertion.

## 2024-06-29 NOTE — Progress Notes (Signed)
 Patient for IR US  guided Liver Biopsy/IR Port Insertion on Friday 06/30/24, I called and LVM for the patient on the phone and gave pre-procedure instructions.VM made patient aware to be here at 10a, NPO after MN prior to procedure as well as driver post procedure/recovery/discharge.  Called 06/29/24

## 2024-06-30 ENCOUNTER — Other Ambulatory Visit: Payer: Self-pay

## 2024-06-30 ENCOUNTER — Ambulatory Visit
Admission: RE | Admit: 2024-06-30 | Discharge: 2024-06-30 | Disposition: A | Discharge status: Home / Self Care | Source: Ambulatory Visit | Attending: Hematology | Admitting: Hematology

## 2024-06-30 ENCOUNTER — Encounter: Payer: Self-pay | Admitting: Radiology

## 2024-06-30 ENCOUNTER — Other Ambulatory Visit: Payer: Self-pay | Admitting: Hematology

## 2024-06-30 DIAGNOSIS — K769 Liver disease, unspecified: Secondary | ICD-10-CM

## 2024-06-30 DIAGNOSIS — C187 Malignant neoplasm of sigmoid colon: Secondary | ICD-10-CM

## 2024-06-30 LAB — CBC
HCT: 36.7 % (ref 36.0–46.0)
Hemoglobin: 12 g/dL (ref 12.0–15.0)
MCH: 29.6 pg (ref 26.0–34.0)
MCHC: 32.7 g/dL (ref 30.0–36.0)
MCV: 90.6 fL (ref 80.0–100.0)
Platelets: 306 K/uL (ref 150–400)
RBC: 4.05 MIL/uL (ref 3.87–5.11)
RDW: 13.3 % (ref 11.5–15.5)
WBC: 5.4 K/uL (ref 4.0–10.5)
nRBC: 0 % (ref 0.0–0.2)

## 2024-06-30 LAB — GLUCOSE, CAPILLARY: Glucose-Capillary: 88 mg/dL (ref 70–99)

## 2024-06-30 LAB — PROTIME-INR
INR: 0.9 (ref 0.8–1.2)
Prothrombin Time: 13.1 s (ref 11.4–15.2)

## 2024-06-30 MED ORDER — MIDAZOLAM HCL (PF) 2 MG/2ML IJ SOLN
INTRAMUSCULAR | Status: AC | PRN
  Administered 2024-06-30: 1 mg via INTRAVENOUS
  Administered 2024-06-30: .5 mg via INTRAVENOUS

## 2024-06-30 MED ORDER — FENTANYL CITRATE (PF) 100 MCG/2ML IJ SOLN
INTRAMUSCULAR | Status: AC
  Filled 2024-06-30: qty 2

## 2024-06-30 MED ORDER — LIDOCAINE HCL 1 % IJ SOLN
INTRAMUSCULAR | Status: AC
  Filled 2024-06-30: qty 20

## 2024-06-30 MED ORDER — HEPARIN SOD (PORK) LOCK FLUSH 100 UNIT/ML IV SOLN
INTRAVENOUS | Status: AC
Start: 2024-06-30 — End: 2024-06-30
  Filled 2024-06-30: qty 5

## 2024-06-30 MED ORDER — SODIUM CHLORIDE 0.9 % IV SOLN: INTRAVENOUS | Status: DC

## 2024-06-30 MED ORDER — CEFAZOLIN SODIUM-DEXTROSE 2-4 GM/100ML-% IV SOLN
2.0000 g | INTRAVENOUS | Status: AC
  Administered 2024-06-30: 2 g via INTRAVENOUS

## 2024-06-30 MED ORDER — MIDAZOLAM HCL 2 MG/2ML IJ SOLN
INTRAMUSCULAR | Status: AC
Start: 2024-06-30 — End: 2024-06-30
  Filled 2024-06-30: qty 2

## 2024-06-30 MED ORDER — CEFAZOLIN SODIUM-DEXTROSE 2-4 GM/100ML-% IV SOLN
INTRAVENOUS | Status: AC
  Filled 2024-06-30: qty 100

## 2024-06-30 MED ORDER — LIDOCAINE HCL 1 % IJ SOLN
INTRAMUSCULAR | Status: AC
Start: 2024-06-30 — End: 2024-06-30
  Filled 2024-06-30: qty 20

## 2024-06-30 MED ORDER — MIDAZOLAM HCL 2 MG/2ML IJ SOLN
INTRAMUSCULAR | Status: AC
  Filled 2024-06-30: qty 2

## 2024-06-30 MED ORDER — HEPARIN SOD (PORK) LOCK FLUSH 100 UNIT/ML IV SOLN
500.0000 [IU] | Freq: Once | INTRAVENOUS | Status: AC
  Administered 2024-06-30: 500 [IU] via INTRAVENOUS

## 2024-06-30 MED ORDER — FENTANYL CITRATE (PF) 100 MCG/2ML IJ SOLN
INTRAMUSCULAR | Status: AC | PRN
  Administered 2024-06-30: 25 ug via INTRAVENOUS
  Administered 2024-06-30: 50 ug via INTRAVENOUS

## 2024-06-30 MED ORDER — LIDOCAINE HCL 1 % IJ SOLN
20.0000 mL | Freq: Once | INTRAMUSCULAR | Status: AC
  Administered 2024-06-30: 17 mL via INTRADERMAL

## 2024-06-30 MED FILL — Fosaprepitant Dimeglumine For IV Infusion 150 MG (Base Eq): INTRAVENOUS | Qty: 5 | Status: AC

## 2024-06-30 NOTE — Procedures (Signed)
 Interventional Radiology Procedure Note  Procedure: Single Lumen Power Port Placement    Access:  Right IJ vein.  Findings: Catheter tip positioned at SVC/RA junction. Port is ready for immediate use.   Complications: None  EBL: < 10 mL  Recommendations:  - Ok to shower in 24 hours - Do not submerge for 7 days - Routine line care   Maxi Carreras T. Fredia Sorrow, M.D Pager:  [phone]

## 2024-07-01 ENCOUNTER — Ambulatory Visit: Payer: Self-pay

## 2024-07-01 DIAGNOSIS — Z808 Family history of malignant neoplasm of other organs or systems: Secondary | ICD-10-CM

## 2024-07-01 DIAGNOSIS — C187 Malignant neoplasm of sigmoid colon: Secondary | ICD-10-CM

## 2024-07-01 DIAGNOSIS — Z800 Family history of malignant neoplasm of digestive organs: Secondary | ICD-10-CM

## 2024-07-01 DIAGNOSIS — Z1379 Encounter for other screening for genetic and chromosomal anomalies: Secondary | ICD-10-CM

## 2024-07-01 NOTE — Progress Notes (Signed)
 HPI:  [name] was previously seen in the [facility] due to a personal history of colon cancer and concerns regarding a hereditary predisposition to cancer. Please refer to our prior cancer genetics clinic note for more information regarding our discussion, assessment and recommendations, at the time. [name] recent genetic test results were disclosed to her, as were recommendations warranted by these results. These results and recommendations are discussed in more detail below.  CANCER HISTORY:  Oncology History  Cancer of sigmoid colon (HCC)  06/05/2024 Cancer Staging   Staging form: Colon and Rectum, AJCC 8th Edition - Clinical stage from 06/05/2024: Stage Unknown (cTX, cN0, cM1) - Signed by Lanny Callander, MD on 06/21/2024 Stage prefix: Initial diagnosis Total positive nodes: 0 Histologic grade (G): G2 Histologic grading system: 4 grade system   06/06/2024 Initial Diagnosis   Cancer of sigmoid colon (HCC)   06/20/2024 Genetic Testing   Negative genetic testing. Report date is 06/20/2024.  The CancerNext-Expanded gene panel offered by [facility] and includes sequencing, rearrangement, and RNA analysis for the following 77 genes: AIP, ALK, APC, ATM, BAP1, BARD1, BMPR1A, BRCA1, BRCA2, BRIP1, CDC73, CDH1, CDK4, CDKN1B, CDKN2A, CEBPA, CHEK2, CTNNA1, DDX41, DICER1, ETV6, FH, FLCN, GATA2, LZTR1, MAX, MBD4, MEN1, MET, MLH1, MSH2, MSH3, MSH6, MUTYH, NF1, NF2, NTHL1, PALB2, PHOX2B, PMS2, POT1, PRKAR1A, PTCH1, PTEN, RAD51C, RAD51D, RB1, RET, RPS20, RUNX1, SDHA, SDHAF2, SDHB, SDHC, SDHD, SMAD4, SMARCA4, SMARCB1, SMARCE1, STK11, SUFU, TMEM127, TP53, TSC1, TSC2, VHL, and WT1 (sequencing and deletion/duplication); AXIN2, CTNNA1, DDX41, EGFR, HOXB13, KIT, MBD4, MITF, MSH3, PDGFRA, POLD1 and POLE (sequencing only); EPCAM and GREM1 (deletion/duplication only). RNA data is routinely analyzed for use in variant interpretation for all genes.    07/03/2024 -  Chemotherapy    Patient is on Treatment Plan : COLORECTAL FOLFIRINOX + Bevacizumab q14d      FAMILY HISTORY:  We obtained a detailed, 4-generation family history.  Significant diagnoses are listed below: Family History  Problem Relation Age of Onset   Hypertension Mother    Diabetes Mother    Anemia Mother    Migraines Mother    Thyroid  cancer Mother 30   Hypertension Maternal Grandmother    Diabetes Maternal Grandmother    Heart disease Maternal Grandfather    Diabetes Maternal Grandfather    Colon cancer Maternal Great-grandfather    Anesthesia problems Neg Hx    BRCA 1/2 Neg Hx    Breast cancer Neg Hx    Pedigree      GENETIC TEST RESULTS: Genetic testing reported out on 06/20/2024 through the CancerNextExpanded +RNAinsight cancer panel found no pathogenic mutations.  [facility] CancerNext-Expanded + RNAinsight gene panel which includes sequencing, rearrangement, and RNA analysis for the following 77 genes: AIP, ALK, APC, ATM, AXIN2, BAP1, BARD1, BMPR1A, BRCA1, BRCA2, BRIP1, CDC73, CDH1, CDK4, CDKN1B, CDKN2A, CEBPA, CHEK2, CTNNA1, DDX41, DICER1, ETV6, FH, FLCN, GATA2, LZTR1, MAX, MBD4, MEN1, MET, MLH1, MSH2, MSH3, MSH6, MUTYH, NF1, NF2, NTHL1, PALB2, PHOX2B, PMS2, POT1, PRKAR1A, PTCH1, PTEN, RAD51C, RAD51D, RB1, RET, RPS20, RUNX1, SDHA, SDHAF2, SDHB, SDHC, SDHD, SMAD4, SMARCA4, SMARCB1, SMARCE1, STK11, SUFU, TMEM127, TP53, TSC1, TSC2, VHL, and WT1 (sequencing and deletion/duplication); EGFR, HOXB13, KIT, MITF, PDGFRA, POLD1, and POLE (sequencing only); EPCAM and GREM1 (deletion/duplication only).   The test report has been scanned into EPIC and is located under the Molecular Pathology section of the Results Review tab.  A portion of the result report is included below for reference.     We discussed with [name] that because current  genetic testing is not perfect, it is possible there may be a gene mutation in one of these genes that current testing cannot detect, but that chance is small.  We  also discussed, that there could be another gene that has not yet been discovered, or that we have not yet tested, that is responsible for the cancer diagnoses in the family. It is also possible there is a hereditary cause for the cancer in the family that [name] did not inherit and therefore was not identified in her testing.  Therefore, it is important to remain in touch with cancer genetics in the future so that we can continue to offer [name] the most up to date genetic testing.   ADDITIONAL GENETIC TESTING: We discussed with [name] that her genetic testing was fairly extensive.  If there are genes identified to increase cancer risk that can be analyzed in the future, we would be happy to discuss and coordinate this testing at that time.    CANCER SCREENING RECOMMENDATIONS: [name] test result is considered negative (normal).  This means that we have not identified a hereditary cause for her personal history of colon cancer at this time. Most cancers happen by chance and this negative test suggests that her personal history of cancer may fall into this category.    Possible reasons for [name] negative genetic test include:  1. There may be a gene mutation in one of these genes that current testing methods cannot detect but that chance is small.  2. There could be another gene that has not yet been discovered, or that we have not yet tested, that is responsible for the cancer diagnoses in the family.  3.  There may be no hereditary risk for cancer in the family. The cancers in [name] and/or her family may be sporadic/familial or due to other genetic and environmental factors. 4. It is also possible there is a hereditary cause for the cancer in the family that [name] did not inherit.  Therefore, it is recommended she continue to follow the cancer management and screening guidelines provided by her oncology and primary healthcare providers. An individual's  cancer risk and medical management are not determined by genetic test results alone. Overall cancer risk assessment incorporates additional factors, including personal medical history, family history, and any available genetic information that may result in a personalized plan for cancer prevention and surveillance  An individual's cancer risk and medical management are not determined by genetic test results alone. Overall cancer risk assessment incorporates additional factors, including personal medical history, family history, and any available genetic information that may result in a personalized plan for cancer prevention and surveillance.  This negative genetic test simply tells us  that we cannot yet define why [name] has had  colorectal cancer at a young age. Ms. [facility] medical management and screening should be based on the prospect that she may be at an increased risk for a second colorectal cancer in the future and should, therefore, undergo more frequent colonoscopy screening at intervals determined by her GI providers.  We also recommended that [name] have an upper endoscopy periodically.  RECOMMENDATIONS FOR FAMILY MEMBERS:  Individuals in this family might be at some increased risk of developing cancer, over the general population risk, simply due to the family history of cancer.  We recommended women in this family have a yearly mammogram beginning at age 51, or 65 years younger than the earliest onset of cancer, an  annual clinical breast exam, and perform monthly breast self-exams. Women in this family should also have a gynecological exam as recommended by their primary provider. All family members should be referred for colonoscopy starting at age 66, or 39 years younger than the earliest onset of cancer.  FOLLOW-UP: Lastly, we discussed with [name] that cancer genetics is a rapidly advancing field and it is possible that new genetic tests will be appropriate for her  and/or her family members in the future. We encouraged her to remain in contact with cancer genetics on an annual basis so we can update her personal and family histories and let her know of advances in cancer genetics that may benefit this family.   Our contact number was provided. [name] questions were answered to her satisfaction, and she knows she is welcome to call us  at anytime with additional questions or concerns.   Santana Fryer, MS, CGC  Certified Genetic Counselor  Email: [email or web address]  Phone: [phone]

## 2024-07-02 NOTE — Assessment & Plan Note (Addendum)
 Probable stage IV with liver metastasis, MMR normal, TMB 3.2, KRAS/NRAS/BRAF wild type, TP53 and APC mutation (+) -Diagnosed in October 2025.  Patient presented with constipation for 4 to 5 months. -Colonoscopy on June 05, 2024 showed a fungating and infiltrative partially obstructing large mass in the sigmoid colon, measuring 10 cm, involving one half of the lumen.  Biopsy confirmed poorly differentiated adenocarcinoma.  MMR normal. -Liver MRI showed 1.9 cm lesion in the duodenum, and 2 additional 1.0 to 1.5 cm mass in the left lobe of liver, concerning for metastasis. -liver biopsy was attempted but not feasible due to location in the dome  -plan to start chemo FOLFOXFIRI on 07/03/2024 with GCSF, plan to add Vectibix from cycle 2

## 2024-07-03 ENCOUNTER — Inpatient Hospital Stay

## 2024-07-03 ENCOUNTER — Other Ambulatory Visit: Payer: Self-pay

## 2024-07-03 ENCOUNTER — Encounter: Payer: Self-pay | Admitting: Hematology

## 2024-07-03 ENCOUNTER — Inpatient Hospital Stay (HOSPITAL_BASED_OUTPATIENT_CLINIC_OR_DEPARTMENT_OTHER): Admitting: Hematology

## 2024-07-03 ENCOUNTER — Other Ambulatory Visit: Payer: Self-pay | Admitting: Pharmacist

## 2024-07-03 VITALS — BP 120/78 | HR 99 | Temp 98.1°F | Resp 17 | Wt 162.4 lb

## 2024-07-03 DIAGNOSIS — D500 Iron deficiency anemia secondary to blood loss (chronic): Secondary | ICD-10-CM

## 2024-07-03 DIAGNOSIS — C187 Malignant neoplasm of sigmoid colon: Secondary | ICD-10-CM

## 2024-07-03 LAB — TOTAL PROTEIN, URINE DIPSTICK: Protein, ur: NEGATIVE mg/dL

## 2024-07-03 LAB — CBC WITH DIFFERENTIAL (CANCER CENTER ONLY)
Abs Immature Granulocytes: 0.01 K/uL (ref 0.00–0.07)
Basophils Absolute: 0 K/uL (ref 0.0–0.1)
Basophils Relative: 0 %
Eosinophils Absolute: 0.1 K/uL (ref 0.0–0.5)
Eosinophils Relative: 2 %
HCT: 37.6 % (ref 36.0–46.0)
Hemoglobin: 12.5 g/dL (ref 12.0–15.0)
Immature Granulocytes: 0 %
Lymphocytes Relative: 32 %
Lymphs Abs: 1.5 K/uL (ref 0.7–4.0)
MCH: 30 pg (ref 26.0–34.0)
MCHC: 33.2 g/dL (ref 30.0–36.0)
MCV: 90.4 fL (ref 80.0–100.0)
Monocytes Absolute: 0.3 K/uL (ref 0.1–1.0)
Monocytes Relative: 5 %
Neutro Abs: 2.8 K/uL (ref 1.7–7.7)
Neutrophils Relative %: 61 %
Platelet Count: 280 K/uL (ref 150–400)
RBC: 4.16 MIL/uL (ref 3.87–5.11)
RDW: 13.4 % (ref 11.5–15.5)
WBC Count: 4.6 K/uL (ref 4.0–10.5)
nRBC: 0 % (ref 0.0–0.2)

## 2024-07-03 LAB — CMP (CANCER CENTER ONLY)
ALT: 9 U/L (ref 0–44)
AST: 15 U/L (ref 15–41)
Albumin: 4.3 g/dL (ref 3.5–5.0)
Alkaline Phosphatase: 142 U/L — ABNORMAL HIGH (ref 38–126)
Anion gap: 9 (ref 5–15)
BUN: 7 mg/dL (ref 6–20)
CO2: 24 mmol/L (ref 22–32)
Calcium: 11.1 mg/dL — ABNORMAL HIGH (ref 8.9–10.3)
Chloride: 108 mmol/L (ref 98–111)
Creatinine: 0.69 mg/dL (ref 0.44–1.00)
GFR, Estimated: >60 mL/min (ref >60)
Glucose, Bld: 103 mg/dL — ABNORMAL HIGH (ref 70–99)
Potassium: 4.1 mmol/L (ref 3.5–5.1)
Sodium: 141 mmol/L (ref 135–145)
Total Bilirubin: 0.4 mg/dL (ref 0.0–1.2)
Total Protein: 7.3 g/dL (ref 6.5–8.1)

## 2024-07-03 LAB — CEA (ACCESS): CEA (CHCC): 11.76 ng/mL — ABNORMAL HIGH (ref 0.00–5.00)

## 2024-07-03 LAB — PREGNANCY, URINE: Preg Test, Ur: NEGATIVE

## 2024-07-03 LAB — FERRITIN: Ferritin: 57 ng/mL (ref 11–307)

## 2024-07-03 MED ORDER — PALONOSETRON HCL INJECTION 0.25 MG/5ML
0.2500 mg | Freq: Once | INTRAVENOUS | Status: AC
  Administered 2024-07-03: 0.25 mg via INTRAVENOUS
  Filled 2024-07-03: qty 5

## 2024-07-03 MED ORDER — SODIUM CHLORIDE 0.9 % IV SOLN
150.0000 mg | Freq: Once | INTRAVENOUS | Status: AC
  Administered 2024-07-03: 150 mg via INTRAVENOUS
  Filled 2024-07-03: qty 150

## 2024-07-03 MED ORDER — DEXAMETHASONE SOD PHOSPHATE PF 10 MG/ML IJ SOLN
10.0000 mg | Freq: Once | INTRAMUSCULAR | Status: AC
  Administered 2024-07-03: 10 mg via INTRAVENOUS

## 2024-07-03 MED ORDER — ATROPINE SULFATE 1 MG/ML IV SOLN
0.5000 mg | Freq: Once | INTRAVENOUS | Status: AC | PRN
  Administered 2024-07-03: 0.5 mg via INTRAVENOUS
  Filled 2024-07-03: qty 1

## 2024-07-03 MED ORDER — SODIUM CHLORIDE 0.9 % IV SOLN
400.0000 mg/m2 | Freq: Once | INTRAVENOUS | Status: AC
  Administered 2024-07-03: 748 mg via INTRAVENOUS
  Filled 2024-07-03: qty 25

## 2024-07-03 MED ORDER — SODIUM CHLORIDE 0.9 % IV SOLN
150.0000 mg/m2 | Freq: Once | INTRAVENOUS | Status: AC
  Administered 2024-07-03: 300 mg via INTRAVENOUS
  Filled 2024-07-03: qty 15

## 2024-07-03 MED ORDER — DEXTROSE 5 % IV SOLN: INTRAVENOUS | Status: DC

## 2024-07-03 MED ORDER — SODIUM CHLORIDE 0.9 % IV SOLN
2400.0000 mg/m2 | INTRAVENOUS | Status: DC
  Administered 2024-07-03: 4500 mg via INTRAVENOUS
  Filled 2024-07-03: qty 90

## 2024-07-03 MED ORDER — OXALIPLATIN CHEMO INJECTION 100 MG/20ML
85.0000 mg/m2 | Freq: Once | INTRAVENOUS | Status: AC
  Administered 2024-07-03: 150 mg via INTRAVENOUS
  Filled 2024-07-03: qty 10

## 2024-07-03 NOTE — Progress Notes (Signed)
 Ok to proceed with tx today per auth team. RN confirmed patient has home meds (not showing released from tx plan).  Jessalyn Hinojosa, PharmD, MBA

## 2024-07-03 NOTE — Progress Notes (Signed)
 [facility]   [phone] [phone]   Clinic Follow up Note   Patient Care Team: Dwight Trula SQUIBB, MD as PCP - General (Internal Medicine) Ardis Evalene CROME, RN as Oncology Nurse Navigator  Date of Service:  07/03/2024  CHIEF COMPLAINT: f/u of metastatic sigmoid colon cancer to liver  CURRENT THERAPY:  FOLFIRINOX, will add Vectibix from cycle 2  Oncology History   Cancer of sigmoid colon (HCC) Probable stage IV with liver metastasis, MMR normal, TMB 3.2, KRAS/NRAS/BRAF wild type, TP53 and APC mutation (+) -Diagnosed in October 2025.  Patient presented with constipation for 4 to 5 months. -Colonoscopy on June 05, 2024 showed a fungating and infiltrative partially obstructing large mass in the sigmoid colon, measuring 10 cm, involving one half of the lumen.  Biopsy confirmed poorly differentiated adenocarcinoma.  MMR normal. -Liver MRI showed 1.9 cm lesion in the duodenum, and 2 additional 1.0 to 1.5 cm mass in the left lobe of liver, concerning for metastasis. -liver biopsy was attempted but feasible due to location in the dome  -plan to start chemo FOLFOXFIRI on 07/03/2024 with GCSF, plan to add Vectibix from cycle 2   Assessment & Plan Metastatic colon cancer involving liver and sigmoid colon Metastatic colon cancer. Liver biopsy was not performed due to high location of the lesion. Small lesions on the sigmoid colon are not visible on ultrasound. Tumor sequencing results indicate KRAS, NRAS, and BRAF mutations are negative, qualifying her for Vectibix (panitumumab) in addition to chemotherapy. Vectibix is expected to improve prognosis as patients with this type of cancer respond well to chemotherapy and the antibody. Potential side effects of Vectibix include skin rash, low magnesium, and mild diarrhea. Bevacizumab was previously prescribed but will be canceled in favor of Vectibix. - Will add Vectibix to chemotherapy regimen in the next cycle. -  Canceled bevacizumab prescription. - Monitor for skin rash, low magnesium, and diarrhea. - Provided cream and oral antibiotics for potential skin rash. - Advised on managing diarrhea with Imodium  and to call if severe.  Chemotherapy-induced adverse effects management  Chemotherapy-induced adverse effects include neuropathy risk, neutropenia risk, cold sensitivity, fatigue, and diarrhea risk were discussed with pt in detail . Cold sensitivity is expected, and neuropathy risk can be mitigated by using ice on hands and feet. Neutropenia risk is highest 7-10 days post-chemotherapy, increasing infection risk. Fatigue is anticipated, especially around the day of pump DC and the following 3-5 days. Diarrhea risk is present, with Imodium  as a management option. - Advised wearing a mask and washing hands when going out, especially 7-10 days post-chemotherapy. - Instructed to monitor for fever and chills and call if she occurs. - Advised using ice on hands and feet to decrease neuropathy risk. - Advised on managing cold sensitivity by avoiding cold drinks and using room temperature or warm drinks. - Advised on managing diarrhea with Imodium  and to call if severe.  Plan - Labs reviewed, adequate for treatment, will proceed for cycle FOLFIRINOX today - I reviewed the NGS [employer] result, and recommend adding Vectibix from cycle 2.  Benefit and potential side effects were reviewed with patient in detail, she agrees to proceed. - Up in 2 weeks.   SUMMARY OF ONCOLOGIC HISTORY: Oncology History  Cancer of sigmoid colon (HCC)  06/05/2024 Cancer Staging   Staging form: Colon and Rectum, AJCC 8th Edition - Clinical stage from 06/05/2024: Stage Unknown (cTX, cN0, cM1) - Signed by Lanny Callander, MD on 06/21/2024 Stage prefix: Initial diagnosis Total positive  nodes: 0 Histologic grade (G): G2 Histologic grading system: 4 grade system   06/06/2024 Initial Diagnosis   Cancer of sigmoid colon (HCC)   06/20/2024  Genetic Testing   Negative genetic testing. Report date is 06/20/2024.  The CancerNext-Expanded gene panel offered by [facility] and includes sequencing, rearrangement, and RNA analysis for the following 77 genes: AIP, ALK, APC, ATM, BAP1, BARD1, BMPR1A, BRCA1, BRCA2, BRIP1, CDC73, CDH1, CDK4, CDKN1B, CDKN2A, CEBPA, CHEK2, CTNNA1, DDX41, DICER1, ETV6, FH, FLCN, GATA2, LZTR1, MAX, MBD4, MEN1, MET, MLH1, MSH2, MSH3, MSH6, MUTYH, NF1, NF2, NTHL1, PALB2, PHOX2B, PMS2, POT1, PRKAR1A, PTCH1, PTEN, RAD51C, RAD51D, RB1, RET, RPS20, RUNX1, SDHA, SDHAF2, SDHB, SDHC, SDHD, SMAD4, SMARCA4, SMARCB1, SMARCE1, STK11, SUFU, TMEM127, TP53, TSC1, TSC2, VHL, and WT1 (sequencing and deletion/duplication); AXIN2, CTNNA1, DDX41, EGFR, HOXB13, KIT, MBD4, MITF, MSH3, PDGFRA, POLD1 and POLE (sequencing only); EPCAM and GREM1 (deletion/duplication only). RNA data is routinely analyzed for use in variant interpretation for all genes.    07/03/2024 - 07/03/2024 Chemotherapy   Patient is on Treatment Plan : COLORECTAL FOLFIRINOX + Bevacizumab q14d     07/03/2024 -  Chemotherapy   Patient is on Treatment Plan : COLORECTAL FOLFIRINOX Q14D        Discussed the use of AI scribe software for clinical note transcription with the patient, who gave verbal consent to proceed.  History of Present Illness Ruth Mckinney is a 48 year old female with metastatic colon cancer who presents for follow-up. She is accompanied by her husband.  A recent attempt at a liver biopsy was unsuccessful due to the lesion's location, and other spots were too small for ultrasound visualization. She had a port placed last Friday and is experiencing tenderness and a burning sensation around the site, likely due to tissue healing.  She attended a chemotherapy class and asked about precautions post-chemotherapy, especially regarding public exposure. She was informed about potential cold sensitivity and neuropathy associated with her  treatment.  Tumor sequencing results showed negative KRAS, NRAS, and BRAF mutations, which may influence her treatment options.     All other systems were reviewed with the patient and are negative.  MEDICAL HISTORY:  Past Medical History:  Diagnosis Date   Abnormal Pap smear    Anemia    Diabetes mellitus (HCC)    Family history of thyroid  cancer    Mother at 69   Genital herpes     SURGICAL HISTORY: Past Surgical History:  Procedure Laterality Date   ADENOIDECTOMY     CESAREAN SECTION     COLPOSCOPY     IR IMAGING GUIDED PORT INSERTION  06/30/2024   LEEP     MOUTH SURGERY      I have reviewed the social history and family history with the patient and they are unchanged from previous note.  ALLERGIES:  is allergic to ibuprofen.  MEDICATIONS:  Current Outpatient Medications  Medication Sig Dispense Refill   insulin  degludec (TRESIBA  FLEXTOUCH) 200 UNIT/ML FlexTouch Pen Inject 36 Units into the skin daily. (Patient taking differently: Inject 22 Units into the skin daily.) 9 mL 3   medroxyPROGESTERone  (DEPO-PROVERA ) 150 MG/ML injection Inject 150 mg into the muscle every 3 (three) months. Reported on 11/06/2015     Semaglutide , 1 MG/DOSE, (OZEMPIC , 1 MG/DOSE,) 4 MG/3ML SOPN Inject 1 mg into the skin once a week. 9 mL 2   valACYclovir  (VALTREX ) 500 MG tablet Take 500 mg by mouth 2 (two) times daily. For 7 days as needed     Blood Glucose Monitoring  Suppl (BAYER CONTOUR LINK 2.4) w/Device KIT Use Bayer Contour link meter to check blood sugar 2 times a day 1 kit 0   glucose blood test strip Use Bayer Contour test strips to check blood sugar 2 times a day 200 each 2   Insulin  Pen Needle 32G X 4 MM MISC Use 1x a day 100 each 3   Microlet Lancets MISC Use to check blood sugar 2 times a day 200 each 2   No current facility-administered medications for this visit.   Facility-Administered Medications Ordered in Other Visits  Medication Dose Route Frequency Provider Last Rate Last  Admin   atropine  injection 0.5 mg  0.5 mg Intravenous Once PRN Lanny Callander, MD       dextrose  5 % solution   Intravenous Continuous Lanny Callander, MD 500 mL/hr at 07/03/24 1003 Infusion Verify at 07/03/24 1003   fluorouracil  (ADRUCIL ) 4,500 mg in sodium chloride  0.9 % 60 mL chemo infusion  2,400 mg/m2 (Treatment Plan Recorded) Intravenous 1 day or 1 dose Lanny Callander, MD       irinotecan  (CAMPTOSAR ) 300 mg in sodium chloride  0.9 % 500 mL chemo infusion  150 mg/m2 (Treatment Plan Recorded) Intravenous Once Lanny Callander, MD       leucovorin  748 mg in sodium chloride  0.9 % 250 mL infusion  400 mg/m2 (Treatment Plan Recorded) Intravenous Once Lanny Callander, MD       oxaliplatin  (ELOXATIN ) 150 mg in dextrose  5 % 500 mL chemo infusion  85 mg/m2 (Treatment Plan Recorded) Intravenous Once Lanny Callander, MD 265 mL/hr at 07/03/24 1037 150 mg at 07/03/24 1037    PHYSICAL EXAMINATION: ECOG PERFORMANCE STATUS: 1 - Symptomatic but completely ambulatory  Vitals:   07/03/24 0825  BP: 120/78  Pulse: 99  Resp: 17  Temp: 98.1 F (36.7 C)  SpO2: 99%   Wt Readings from Last 3 Encounters:  07/03/24 162 lb 6.4 oz (73.7 kg)  06/30/24 164 lb 9.6 oz (74.7 kg)  06/07/24 165 lb 8 oz (75.1 kg)     GENERAL:alert, no distress and comfortable SKIN: skin color, texture, turgor are normal, no rashes or significant lesions EYES: normal, Conjunctiva are pink and non-injected, sclera clear NECK: supple, thyroid  normal size, non-tender, without nodularity LYMPH:  no palpable lymphadenopathy in the cervical, axillary  LUNGS: clear to auscultation and percussion with normal breathing effort HEART: regular rate & rhythm and no murmurs and no lower extremity edema ABDOMEN:abdomen soft, non-tender and normal bowel sounds Musculoskeletal:no cyanosis of digits and no clubbing  NEURO: alert & oriented x 3 with fluent speech, no focal motor/sensory deficits  Physical Exam    LABORATORY DATA:  I have reviewed the data as listed    Latest  Ref Rng & Units 07/03/2024    7:45 AM 06/30/2024    9:46 AM 06/07/2024    4:27 PM  CBC  WBC 4.0 - 10.5 K/uL 4.6  5.4  5.3   Hemoglobin 12.0 - 15.0 g/dL 87.4  87.9  86.7   Hematocrit 36.0 - 46.0 % 37.6  36.7  40.1   Platelets 150 - 400 K/uL 280  306  225         Latest Ref Rng & Units 07/03/2024    7:45 AM 06/07/2024    4:27 PM 05/17/2020    9:25 AM  CMP  Glucose 70 - 99 mg/dL 896  880  785   BUN 6 - 20 mg/dL 7  <5  10   Creatinine 0.44 - 1.00 mg/dL  0.69  0.76  0.85   Sodium 135 - 145 mmol/L 141  140  137   Potassium 3.5 - 5.1 mmol/L 4.1  4.3  4.2   Chloride 98 - 111 mmol/L 108  105  105   CO2 22 - 32 mmol/L 24  24  25    Calcium  8.9 - 10.3 mg/dL 88.8  88.5  89.5   Total Protein 6.5 - 8.1 g/dL 7.3  7.7  7.0   Total Bilirubin 0.0 - 1.2 mg/dL 0.4  0.4  0.3   Alkaline Phos 38 - 126 U/L 142  129  94   AST 15 - 41 U/L 15  19  11    ALT 0 - 44 U/L 9  8  20        RADIOGRAPHIC STUDIES: I have personally reviewed the radiological images as listed and agreed with the findings in the report. No results found.    Orders Placed This Encounter  Procedures   Consent Attestation for Oncology Treatment    The patient is informed of risks, benefits, side-effects of the prescribed oncology treatment. Potential short term and long term side effects and response rates discussed. After a long discussion, the patient made informed decision to proceed.:   Yes   CBC with Differential (Cancer Center Only)    Standing Status:   Future    Expected Date:   07/04/2024    Expiration Date:   07/04/2025   CMP (Cancer Center only)    Standing Status:   Future    Expected Date:   07/04/2024    Expiration Date:   07/04/2025   CBC with Differential (Cancer Center Only)    Standing Status:   Future    Expected Date:   07/18/2024    Expiration Date:   07/18/2025   CMP (Cancer Center only)    Standing Status:   Future    Expected Date:   07/18/2024    Expiration Date:   07/18/2025   CBC with Differential  (Cancer Center Only)    Standing Status:   Future    Expected Date:   08/01/2024    Expiration Date:   08/01/2025   CMP (Cancer Center only)    Standing Status:   Future    Expected Date:   08/01/2024    Expiration Date:   08/01/2025   All questions were answered. The patient knows to call the clinic with any problems, questions or concerns. No barriers to learning was detected. The total time spent in the appointment was 40 minutes, including review of chart and various tests results, discussions about plan of care and coordination of care plan     Onita Mattock, MD 07/03/2024

## 2024-07-03 NOTE — Progress Notes (Signed)
 PATIENT NAVIGATOR PROGRESS NOTE  Name: Ruth Mckinney Date: 07/03/2024 MRN: [id number]  DOB: 09/13/1975   Met with patient during her first chemo infusion appointment.  Informed patient my role in her care is now done and that patient will need to reach out to Dr. Lanny and her nursing team with any questions or concerns after today.  Patient verbalized understanding.   Patient is established with a treatment plan and is actively engaged in care. Nurse Navigator services not currently indicated at this time. Will re-evaluate if needs change or if additional support is requested.     Time spent counseling/coordinating care: 30-45 minutes

## 2024-07-03 NOTE — Progress Notes (Signed)
 Pt reports nausea during irinotecan  infusion. Infusion paused, Atropine  given per [facility]. Pt  felt better and denies nausea after atropine . Infusion restarted

## 2024-07-03 NOTE — Patient Instructions (Signed)
 [facility] - A DEPT OF [facility]  Discharge Instructions: Thank you for choosing [facility] to provide your oncology and hematology care.   If you have a lab appointment with the Cancer Center, please go directly to the Cancer Center and check in at the registration area.   Wear comfortable clothing and clothing appropriate for easy access to any Portacath or PICC line.   We strive to give you quality time with your provider. You may need to reschedule your appointment if you arrive late (15 or more minutes).  Arriving late affects you and other patients whose appointments are after yours.  Also, if you miss three or more appointments without notifying the office, you may be dismissed from the clinic at the provider's discretion.      For prescription refill requests, have your pharmacy contact our office and allow 72 hours for refills to be completed.    Today you received the following chemotherapy and/or immunotherapy agents: Oxaliplatin, Irinotecan, Leucovorin, Fluorouracil      To help prevent nausea and vomiting after your treatment, we encourage you to take your nausea medication as directed.  BELOW ARE SYMPTOMS THAT SHOULD BE REPORTED IMMEDIATELY: *FEVER GREATER THAN 100.4 F (38 C) OR HIGHER *CHILLS OR SWEATING *NAUSEA AND VOMITING THAT IS NOT CONTROLLED WITH YOUR NAUSEA MEDICATION *UNUSUAL SHORTNESS OF BREATH *UNUSUAL BRUISING OR BLEEDING *URINARY PROBLEMS (pain or burning when urinating, or frequent urination) *BOWEL PROBLEMS (unusual diarrhea, constipation, pain near the anus) TENDERNESS IN MOUTH AND THROAT WITH OR WITHOUT PRESENCE OF ULCERS (sore throat, sores in mouth, or a toothache) UNUSUAL RASH, SWELLING OR PAIN  UNUSUAL VAGINAL DISCHARGE OR ITCHING   Items with * indicate a potential emergency and should be followed up as soon as possible or go to the Emergency Department if any problems should occur.  Please show the  CHEMOTHERAPY ALERT CARD or IMMUNOTHERAPY ALERT CARD at check-in to the Emergency Department and triage nurse.  Should you have questions after your visit or need to cancel or reschedule your appointment, please contact [facility] - A DEPT OF [facility]  Dept: [phone]  and follow the prompts.  Office hours are 8:00 a.m. to 4:30 p.m. Monday - Friday. Please note that voicemails left after 4:00 p.m. may not be returned until the following business day.  We are closed weekends and major holidays. You have access to a nurse at all times for urgent questions. Please call the main number to the clinic Dept: [phone] and follow the prompts.   For any non-urgent questions, you may also contact your provider using MyChart. We now offer e-Visits for anyone 79 and older to request care online for non-urgent symptoms. For details visit [email or web address].   Also download the MyChart app! Go to the app store, search "MyChart", open the app, select , and log in with your MyChart username and password.  Oxaliplatin Injection What is this medication? OXALIPLATIN (ox AL i PLA tin) treats colorectal cancer. It works by slowing down the growth of cancer cells. This medicine may be used for other purposes; ask your health care provider or pharmacist if you have questions. COMMON BRAND NAME(S): Eloxatin What should I tell my care team before I take this medication? They need to know if you have any of these conditions: Heart disease History of irregular heartbeat or rhythm Liver disease Low blood cell levels (white cells, red cells,  and platelets) Lung or breathing disease, such as asthma Take medications that treat or prevent blood clots Tingling of the fingers, toes, or other nerve disorder An unusual or allergic reaction to oxaliplatin, other medications, foods, dyes, or preservatives If you or your partner are pregnant or trying to get  pregnant Breast-feeding How should I use this medication? This medication is injected into a vein. It is given by your care team in a hospital or clinic setting. Talk to your care team about the use of this medication in children. Special care may be needed. Overdosage: If you think you have taken too much of this medicine contact a poison control center or emergency room at once. NOTE: This medicine is only for you. Do not share this medicine with others. What if I miss a dose? Keep appointments for follow-up doses. It is important not to miss a dose. Call your care team if you are unable to keep an appointment. What may interact with this medication? Do not take this medication with any of the following: Cisapride Dronedarone Pimozide Thioridazine This medication may also interact with the following: Aspirin and aspirin-like medications Certain medications that treat or prevent blood clots, such as warfarin, apixaban, dabigatran, and rivaroxaban Cisplatin Cyclosporine Diuretics Medications for infection, such as acyclovir, adefovir, amphotericin B, bacitracin, cidofovir, foscarnet, ganciclovir, gentamicin, pentamidine, vancomycin NSAIDs, medications for pain and inflammation, such as ibuprofen or naproxen Other medications that cause heart rhythm changes Pamidronate Zoledronic acid This list may not describe all possible interactions. Give your health care provider a list of all the medicines, herbs, non-prescription drugs, or dietary supplements you use. Also tell them if you smoke, drink alcohol, or use illegal drugs. Some items may interact with your medicine. What should I watch for while using this medication? Your condition will be monitored carefully while you are receiving this medication. You may need blood work while taking this medication. This medication may make you feel generally unwell. This is not uncommon as chemotherapy can affect healthy cells as well as cancer  cells. Report any side effects. Continue your course of treatment even though you feel ill unless your care team tells you to stop. This medication may increase your risk of getting an infection. Call your care team for advice if you get a fever, chills, sore throat, or other symptoms of a cold or flu. Do not treat yourself. Try to avoid being around people who are sick. Avoid taking medications that contain aspirin, acetaminophen, ibuprofen, naproxen, or ketoprofen unless instructed by your care team. These medications may hide a fever. Be careful brushing or flossing your teeth or using a toothpick because you may get an infection or bleed more easily. If you have any dental work done, tell your dentist you are receiving this medication. This medication can make you more sensitive to cold. Do not drink cold drinks or use ice. Cover exposed skin before coming in contact with cold temperatures or cold objects. When out in cold weather wear warm clothing and cover your mouth and nose to warm the air that goes into your lungs. Tell your care team if you get sensitive to the cold. Talk to your care team if you or your partner are pregnant or think either of you might be pregnant. This medication can cause serious birth defects if taken during pregnancy and for 9 months after the last dose. A negative pregnancy test is required before starting this medication. A reliable form of contraception is recommended while taking this  medication and for 9 months after the last dose. Talk to your care team about effective forms of contraception. Do not father a child while taking this medication and for 6 months after the last dose. Use a condom while having sex during this time period. Do not breastfeed while taking this medication and for 3 months after the last dose. This medication may cause infertility. Talk to your care team if you are concerned about your fertility. What side effects may I notice from receiving this  medication? Side effects that you should report to your care team as soon as possible: Allergic reactions--skin rash, itching, hives, swelling of the face, lips, tongue, or throat Bleeding--bloody or black, tar-like stools, vomiting blood or brown material that looks like coffee grounds, red or dark brown urine, small red or purple spots on skin, unusual bruising or bleeding Dry cough, shortness of breath or trouble breathing Heart rhythm changes--fast or irregular heartbeat, dizziness, feeling faint or lightheaded, chest pain, trouble breathing Infection--fever, chills, cough, sore throat, wounds that don't heal, pain or trouble when passing urine, general feeling of discomfort or being unwell Liver injury--right upper belly pain, loss of appetite, nausea, light-colored stool, dark yellow or brown urine, yellowing skin or eyes, unusual weakness or fatigue Low red blood cell level--unusual weakness or fatigue, dizziness, headache, trouble breathing Muscle injury--unusual weakness or fatigue, muscle pain, dark yellow or brown urine, decrease in amount of urine Pain, tingling, or numbness in the hands or feet Sudden and severe headache, confusion, change in vision, seizures, which may be signs of posterior reversible encephalopathy syndrome (PRES) Unusual bruising or bleeding Side effects that usually do not require medical attention (report to your care team if they continue or are bothersome): Diarrhea Nausea Pain, redness, or swelling with sores inside the mouth or throat Unusual weakness or fatigue Vomiting This list may not describe all possible side effects. Call your doctor for medical advice about side effects. You may report side effects to FDA at 1-800-FDA-1088. Where should I keep my medication? This medication is given in a hospital or clinic. It will not be stored at home. NOTE: This sheet is a summary. It may not cover all possible information. If you have questions about this  medicine, talk to your doctor, pharmacist, or health care provider.  2024 Elsevier/Gold Standard (2023-07-09 00:00:00)  Irinotecan Injection What is this medication? IRINOTECAN (ir in oh TEE kan) treats some types of cancer. It works by slowing down the growth of cancer cells. This medicine may be used for other purposes; ask your health care provider or pharmacist if you have questions. COMMON BRAND NAME(S): Camptosar What should I tell my care team before I take this medication? They need to know if you have any of these conditions: Dehydration Diarrhea Infection, especially a viral infection, such as chickenpox, cold sores, herpes Liver disease Low blood cell levels (white cells, red cells, and platelets) Low levels of electrolytes, such as calcium, magnesium, or potassium in your blood Recent or ongoing radiation An unusual or allergic reaction to irinotecan, other medications, foods, dyes, or preservatives If you or your partner are pregnant or trying to get pregnant Breast-feeding How should I use this medication? This medication is injected into a vein. It is given by your care team in a hospital or clinic setting. Talk to your care team about the use of this medication in children. Special care may be needed. Overdosage: If you think you have taken too much of this medicine contact a  poison control center or emergency room at once. NOTE: This medicine is only for you. Do not share this medicine with others. What if I miss a dose? Keep appointments for follow-up doses. It is important not to miss your dose. Call your care team if you are unable to keep an appointment. What may interact with this medication? Do not take this medication with any of the following: Cobicistat Itraconazole This medication may also interact with the following: Certain antibiotics, such as clarithromycin, rifampin, rifabutin Certain antivirals for HIV or AIDS Certain medications for fungal  infections, such as ketoconazole, posaconazole, voriconazole Certain medications for seizures, such as carbamazepine, phenobarbital, phenytoin Gemfibrozil Nefazodone St. John's wort This list may not describe all possible interactions. Give your health care provider a list of all the medicines, herbs, non-prescription drugs, or dietary supplements you use. Also tell them if you smoke, drink alcohol, or use illegal drugs. Some items may interact with your medicine. What should I watch for while using this medication? Your condition will be monitored carefully while you are receiving this medication. You may need blood work while taking this medication. This medication may make you feel generally unwell. This is not uncommon as chemotherapy can affect healthy cells as well as cancer cells. Report any side effects. Continue your course of treatment even though you feel ill unless your care team tells you to stop. This medication can cause serious side effects. To reduce the risk, your care team may give you other medications to take before receiving this one. Be sure to follow the directions from your care team. This medication may affect your coordination, reaction time, or judgement. Do not drive or operate machinery until you know how this medication affects you. Sit up or stand slowly to reduce the risk of dizzy or fainting spells. Drinking alcohol with this medication can increase the risk of these side effects. This medication may increase your risk of getting an infection. Call your care team for advice if you get a fever, chills, sore throat, or other symptoms of a cold or flu. Do not treat yourself. Try to avoid being around people who are sick. Avoid taking medications that contain aspirin, acetaminophen, ibuprofen, naproxen, or ketoprofen unless instructed by your care team. These medications may hide a fever. This medication may increase your risk to bruise or bleed. Call your care team if you  notice any unusual bleeding. Be careful brushing or flossing your teeth or using a toothpick because you may get an infection or bleed more easily. If you have any dental work done, tell your dentist you are receiving this medication. Talk to your care team if you or your partner are pregnant or think either of you might be pregnant. This medication can cause serious birth defects if taken during pregnancy and for 6 months after the last dose. You will need a negative pregnancy test before starting this medication. Contraception is recommended while taking this medication and for 6 months after the last dose. Your care team can help you find the option that works for you. Do not father a child while taking this medication and for 3 months after the last dose. Use a condom for contraception during this time period. Do not breastfeed while taking this medication and for 7 days after the last dose. This medication may cause infertility. Talk to your care team if you are concerned about your fertility. What side effects may I notice from receiving this medication? Side effects that you should  report to your care team as soon as possible: Allergic reactions--skin rash, itching, hives, swelling of the face, lips, tongue, or throat Dry cough, shortness of breath or trouble breathing Increased saliva or tears, increased sweating, stomach cramping, diarrhea, small pupils, unusual weakness or fatigue, slow heartbeat Infection--fever, chills, cough, sore throat, wounds that don't heal, pain or trouble when passing urine, general feeling of discomfort or being unwell Kidney injury--decrease in the amount of urine, swelling of the ankles, hands, or feet Low red blood cell level--unusual weakness or fatigue, dizziness, headache, trouble breathing Severe or prolonged diarrhea Unusual bruising or bleeding Side effects that usually do not require medical attention (report to your care team if they continue or are  bothersome): Constipation Diarrhea Hair loss Loss of appetite Nausea Stomach pain This list may not describe all possible side effects. Call your doctor for medical advice about side effects. You may report side effects to FDA at 1-800-FDA-1088. Where should I keep my medication? This medication is given in a hospital or clinic. It will not be stored at home. NOTE: This sheet is a summary. It may not cover all possible information. If you have questions about this medicine, talk to your doctor, pharmacist, or health care provider.  2024 Elsevier/Gold Standard (2021-12-08 00:00:00)  Leucovorin Injection What is this medication? LEUCOVORIN (loo koe VOR in) prevents side effects from certain medications, such as methotrexate. It works by increasing folate levels. This helps protect healthy cells in your body. It may also be used to treat anemia caused by low levels of folate. It can also be used with fluorouracil, a type of chemotherapy, to treat colorectal cancer. It works by increasing the effects of fluorouracil in the body. This medicine may be used for other purposes; ask your health care provider or pharmacist if you have questions. What should I tell my care team before I take this medication? They need to know if you have any of these conditions: Anemia from low levels of vitamin B12 in the blood An unusual or allergic reaction to leucovorin, folic acid, other medications, foods, dyes, or preservatives Pregnant or trying to get pregnant Breastfeeding How should I use this medication? This medication is injected into a vein or a muscle. It is given by your care team in a hospital or clinic setting. Talk to your care team about the use of this medication in children. Special care may be needed. Overdosage: If you think you have taken too much of this medicine contact a poison control center or emergency room at once. NOTE: This medicine is only for you. Do not share this medicine with  others. What if I miss a dose? Keep appointments for follow-up doses. It is important not to miss your dose. Call your care team if you are unable to keep an appointment. What may interact with this medication? Capecitabine Fluorouracil Phenobarbital Phenytoin Primidone Trimethoprim;sulfamethoxazole This list may not describe all possible interactions. Give your health care provider a list of all the medicines, herbs, non-prescription drugs, or dietary supplements you use. Also tell them if you smoke, drink alcohol, or use illegal drugs. Some items may interact with your medicine. What should I watch for while using this medication? Your condition will be monitored carefully while you are receiving this medication. This medication may increase the side effects of 5-fluorouracil. Tell your care team if you have diarrhea or mouth sores that do not get better or that get worse. What side effects may I notice from receiving this medication?  Side effects that you should report to your care team as soon as possible: Allergic reactions--skin rash, itching, hives, swelling of the face, lips, tongue, or throat This list may not describe all possible side effects. Call your doctor for medical advice about side effects. You may report side effects to FDA at 1-800-FDA-1088. Where should I keep my medication? This medication is given in a hospital or clinic. It will not be stored at home. NOTE: This sheet is a summary. It may not cover all possible information. If you have questions about this medicine, talk to your doctor, pharmacist, or health care provider.  2024 Elsevier/Gold Standard (2021-12-30 00:00:00)  Fluorouracil Injection What is this medication? FLUOROURACIL (flure oh YOOR a sil) treats some types of cancer. It works by slowing down the growth of cancer cells. This medicine may be used for other purposes; ask your health care provider or pharmacist if you have questions. COMMON BRAND  NAME(S): Adrucil What should I tell my care team before I take this medication? They need to know if you have any of these conditions: Blood disorders Dihydropyrimidine dehydrogenase (DPD) deficiency Infection, such as chickenpox, cold sores, herpes Kidney disease Liver disease Poor nutrition Recent or ongoing radiation therapy An unusual or allergic reaction to fluorouracil, other medications, foods, dyes, or preservatives If you or your partner are pregnant or trying to get pregnant Breast-feeding How should I use this medication? This medication is injected into a vein. It is administered by your care team in a hospital or clinic setting. Talk to your care team about the use of this medication in children. Special care may be needed. Overdosage: If you think you have taken too much of this medicine contact a poison control center or emergency room at once. NOTE: This medicine is only for you. Do not share this medicine with others. What if I miss a dose? Keep appointments for follow-up doses. It is important not to miss your dose. Call your care team if you are unable to keep an appointment. What may interact with this medication? Do not take this medication with any of the following: Live virus vaccines This medication may also interact with the following: Medications that treat or prevent blood clots, such as warfarin, enoxaparin, dalteparin This list may not describe all possible interactions. Give your health care provider a list of all the medicines, herbs, non-prescription drugs, or dietary supplements you use. Also tell them if you smoke, drink alcohol, or use illegal drugs. Some items may interact with your medicine. What should I watch for while using this medication? Your condition will be monitored carefully while you are receiving this medication. This medication may make you feel generally unwell. This is not uncommon as chemotherapy can affect healthy cells as well as  cancer cells. Report any side effects. Continue your course of treatment even though you feel ill unless your care team tells you to stop. In some cases, you may be given additional medications to help with side effects. Follow all directions for their use. This medication may increase your risk of getting an infection. Call your care team for advice if you get a fever, chills, sore throat, or other symptoms of a cold or flu. Do not treat yourself. Try to avoid being around people who are sick. This medication may increase your risk to bruise or bleed. Call your care team if you notice any unusual bleeding. Be careful brushing or flossing your teeth or using a toothpick because you may get an  infection or bleed more easily. If you have any dental work done, tell your dentist you are receiving this medication. Avoid taking medications that contain aspirin, acetaminophen, ibuprofen, naproxen, or ketoprofen unless instructed by your care team. These medications may hide a fever. Do not treat diarrhea with over the counter products. Contact your care team if you have diarrhea that lasts more than 2 days or if it is severe and watery. This medication can make you more sensitive to the sun. Keep out of the sun. If you cannot avoid being in the sun, wear protective clothing and sunscreen. Do not use sun lamps, tanning beds, or tanning booths. Talk to your care team if you or your partner wish to become pregnant or think you might be pregnant. This medication can cause serious birth defects if taken during pregnancy and for 3 months after the last dose. A reliable form of contraception is recommended while taking this medication and for 3 months after the last dose. Talk to your care team about effective forms of contraception. Do not father a child while taking this medication and for 3 months after the last dose. Use a condom while having sex during this time period. Do not breastfeed while taking this  medication. This medication may cause infertility. Talk to your care team if you are concerned about your fertility. What side effects may I notice from receiving this medication? Side effects that you should report to your care team as soon as possible: Allergic reactions--skin rash, itching, hives, swelling of the face, lips, tongue, or throat Heart attack--pain or tightness in the chest, shoulders, arms, or jaw, nausea, shortness of breath, cold or clammy skin, feeling faint or lightheaded Heart failure--shortness of breath, swelling of the ankles, feet, or hands, sudden weight gain, unusual weakness or fatigue Heart rhythm changes--fast or irregular heartbeat, dizziness, feeling faint or lightheaded, chest pain, trouble breathing High ammonia level--unusual weakness or fatigue, confusion, loss of appetite, nausea, vomiting, seizures Infection--fever, chills, cough, sore throat, wounds that don't heal, pain or trouble when passing urine, general feeling of discomfort or being unwell Low red blood cell level--unusual weakness or fatigue, dizziness, headache, trouble breathing Pain, tingling, or numbness in the hands or feet, muscle weakness, change in vision, confusion or trouble speaking, loss of balance or coordination, trouble walking, seizures Redness, swelling, and blistering of the skin over hands and feet Severe or prolonged diarrhea Unusual bruising or bleeding Side effects that usually do not require medical attention (report to your care team if they continue or are bothersome): Dry skin Headache Increased tears Nausea Pain, redness, or swelling with sores inside the mouth or throat Sensitivity to light Vomiting This list may not describe all possible side effects. Call your doctor for medical advice about side effects. You may report side effects to FDA at 1-800-FDA-1088. Where should I keep my medication? This medication is given in a hospital or clinic. It will not be stored  at home. NOTE: This sheet is a summary. It may not cover all possible information. If you have questions about this medicine, talk to your doctor, pharmacist, or health care provider.  2024 Elsevier/Gold Standard (2021-12-02 00:00:00)

## 2024-07-04 ENCOUNTER — Ambulatory Visit: Payer: Self-pay | Admitting: *Deleted

## 2024-07-04 ENCOUNTER — Encounter: Payer: Self-pay | Admitting: Hematology

## 2024-07-04 ENCOUNTER — Other Ambulatory Visit: Payer: Self-pay

## 2024-07-04 MED ORDER — PROCHLORPERAZINE MALEATE 10 MG PO TABS: 10.0000 mg | ORAL_TABLET | Freq: Four times a day (QID) | ORAL | 0 refills | Status: AC | PRN

## 2024-07-04 MED ORDER — ONDANSETRON HCL 8 MG PO TABS: 8.0000 mg | ORAL_TABLET | Freq: Three times a day (TID) | ORAL | 1 refills | Status: AC | PRN

## 2024-07-04 MED ORDER — LOPERAMIDE HCL 2 MG PO CAPS: 4.0000 mg | ORAL_CAPSULE | ORAL | 1 refills | Status: AC | PRN

## 2024-07-04 MED ORDER — DEXAMETHASONE 4 MG PO TABS: 8.0000 mg | ORAL_TABLET | Freq: Two times a day (BID) | ORAL | 1 refills | Status: AC

## 2024-07-05 ENCOUNTER — Inpatient Hospital Stay

## 2024-07-05 ENCOUNTER — Other Ambulatory Visit: Payer: Self-pay

## 2024-07-05 VITALS — BP 109/81 | HR 93 | Temp 99.0°F | Resp 17

## 2024-07-05 DIAGNOSIS — C187 Malignant neoplasm of sigmoid colon: Secondary | ICD-10-CM

## 2024-07-05 MED ORDER — PEGFILGRASTIM INJECTION 6 MG/0.6ML ~~LOC~~
6.0000 mg | PREFILLED_SYRINGE | Freq: Once | SUBCUTANEOUS | Status: AC
  Administered 2024-07-05: 6 mg via SUBCUTANEOUS
  Filled 2024-07-05: qty 0.6

## 2024-07-11 ENCOUNTER — Encounter: Payer: Self-pay | Admitting: Hematology

## 2024-07-12 ENCOUNTER — Encounter: Payer: Self-pay | Admitting: Gynecologic Oncology

## 2024-07-13 ENCOUNTER — Encounter: Payer: Self-pay | Admitting: Hematology

## 2024-07-13 NOTE — Progress Notes (Unsigned)
 GYNECOLOGIC ONCOLOGY NEW PATIENT CONSULTATION   Patient Name: Ruth Mckinney  Patient Age: 48 y.o. Date of Service: 07/14/24 Referring Provider: Bernarda Ned, MD  Primary Care Provider: Dwight Trula SQUIBB, MD Consulting Provider: Comer Dollar, MD   Assessment/Plan:  Pre versus perimenopausal patient with newly diagnosed colon adenocarcinoma, now on neoadjuvant chemotherapy.  Patient is overall doing well although having some side effects related to recent treatment.  We discussed her imaging, notably her CT abdomen and pelvis.  We looked at pictures together.  On sagittal views, it is difficult to tell if there is mass effect from the sigmoid tumor on the uterus versus some involvement of the posterior aspect of the uterus.  On exam, the to move in conjunction, which unfortunately does not completely rule out the possibility that the tumor is adherent to the posterior uterus or invading the posterior aspect.  Also discussed the difficulty in visualizing the left adnexa is separate from this mass.  Current treatment plan is to proceed with chemotherapy and depending on response, the patient may be a candidate for surgery to remove her primary tumor.  I share Dr. Ned' concerns that there is risk of GYN organ involvement given proximity and appearance on imaging of her sigmoid tumor.  If the mass is adherent or invasive into the uterus, would recommend en bloc resection of the uterus and cervix with her colon.  Discussed removal of 1 or both ovaries if these are also involved.  If adnexa are free from disease, we discussed risk versus benefit of bilateral salpingo-oophorectomy if hysterectomy is pursued given her age and onset of symptoms recently that would suggest she is at least perimenopausal.  I have asked her to think about this decision between now and any more definitive plans for surgery.  Patient is aware that she would see me back if surgery is scheduled to discuss the procedure in  more detail.  Patient has a remote history of a LEEP cervical dysplasia.  She follows with her OB/GYN for this history.  A copy of this note was sent to the patient's referring provider.   60 minutes of total time was spent for this patient encounter, including preparation, face-to-face counseling with the patient and coordination of care, and documentation of the encounter.  Comer Dollar, MD  Division of Gynecologic Oncology  Department of Obstetrics and Gynecology  [facility]  ___________________________________________  Chief Complaint: Chief Complaint  Patient presents with   Colon Cancer    History of Present Illness:  Ruth Mckinney is a 48 y.o. y.o. female who is seen in consultation at the request of Dr. Ned for an evaluation of colon adenocarcinoma that may be involving pelvic organs.  The patient initially presented with months of constipation.  She was also experiencing abdominal pain and weight loss.  On colonoscopy on 10/27, a fungating and infiltrative partially obstructing mass was noted within the sigmoid colon measuring 10 cm and involving one half of the lumen.  Some diverticulosis was noted but otherwise the remainder of the colon was normal.  Pathology revealed a poorly differentiated adenocarcinoma, mismatch repair proficient.  Staging imaging showed a 1.9 cm lesion in the duodenum and 2 masses in the left lobe of the liver measuring up to 1.5 cm concerning for metastasis.  Liver biopsy was attempted but not feasible due to location of these masses.  Patient began neoadjuvant chemotherapy with FOLFIRINOX on 11/24. Plan is to add panitumumab  to her regimen based on NGS. Germline testing  was negative.   Today, the patient presents with her mother.  She reports overall doing well since starting chemotherapy.  Has had some nausea, no emesis.  Had been taking MiraLAX for her constipation but has mostly stopped this and is now  struggling with some intermittent diarrhea with chemotherapy.  Uses antidiarrheal medication as needed.  Has felt quite fatigued the last several days.  Appetite has been up and down.  Gets hungry but has early satiety.  Drinking boost for nutritional support.  Previously was having pelvic pain at the time of presentation, no significant pain recently.  Medical history is notable for type 2 diabetes.  On Tresiba  and Ozempic .  CBG values at home normally range between 97 and 120.  Patient has also been followed by dermatology and recently treated for vitiligo.  She uses clobetasol now intermittently.  When uses the medication, symptoms resolved.  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  PAST MEDICAL HISTORY:  Past Medical History:  Diagnosis Date   Abnormal Pap smear    Anemia    Diabetes mellitus (HCC)    Family history of thyroid  cancer    Mother at 52   Genital herpes      PAST SURGICAL HISTORY:  Past Surgical History:  Procedure Laterality Date   ADENOIDECTOMY     CESAREAN SECTION     COLPOSCOPY     IR IMAGING GUIDED PORT INSERTION  06/30/2024   LEEP     MOUTH SURGERY      OB/GYN HISTORY:  OB History  Gravida Para Term Preterm AB Living  3 2 2  0 1 2  SAB IAB Ectopic Multiple Live Births  1 0 0 0     # Outcome Date GA Lbr Len/2nd Weight Sex Type Anes PTL Lv  3 SAB           2 Term           1 Term             No LMP recorded. Patient has had an injection.  Age at menarche: 34 Hx of STDs: yes Last pap: 2025 History of abnormal pap smears: Had abnormal Pap in 2024 requiring just to repeat this year.  Has a history of cervical dysplasia treated with a LEEP over 20 years ago  The patient has been on Depo-Provera  for at least the last 5 years.  She is amenorrheic.  She notes some more recent hot flashes. This was started for heavy periods with severe cramping.  She had previously been on OCPs but had a lot of breakthrough bleeding.  Given this and  the fact that she was smoking at the time, decision made to transition to Depo-Provera .  Had been taking calcium  although PCP had her stop this when calcium  became elevated.  She was diagnosed with endometriosis years ago by symptoms.  OB/GYN is Madolyn Monte.  Endorses some recent dyspareunia related to pain and feeling as if she needed to defecate with intercourse.  SCREENING STUDIES:  Last mammogram: 2025  Last colonoscopy: 2025  MEDICATIONS: Outpatient Encounter Medications as of 07/14/2024  Medication Sig   atorvastatin (LIPITOR) 20 MG tablet    clindamycin  (CLEOCIN  T) 1 % external solution Takes as needed.   clobetasol ointment (TEMOVATE) 0.05 % APPLY OINTMENT TOPICALLY TWICE DAILY FOR UP TO 2 WEEKS CONSECUTIVE THEN SWITCH TO TACROLIMUS FOR A WEEK, THEN ROTATE BACK (Patient taking differently: Takes as needed.)   desonide (DESOWEN) 0.05 % ointment APPLY  OINTMENT TOPICALLY TWICE DAILY (Patient taking differently: Takes as needed.)   dexamethasone  (DECADRON ) 4 MG tablet Take 2 tablets (8 mg total) by mouth 2 (two) times daily with a meal. Take 2 tablets (8mg ) daily x3 days starting the day after chemotherapy.  Take with food.   hydroquinone 4 % cream APPLY CREAM TOPICALLY TO AFFECTED AREA ONCE DAILY FOR UP TO 3 MONTHS (Patient taking differently: Takes as needed.)   insulin  degludec (TRESIBA  FLEXTOUCH) 200 UNIT/ML FlexTouch Pen Inject 36 Units into the skin daily.   ketoconazole (NIZORAL) 2 % cream APPLY CREAM TOPICALLY TWICE DAILY (Patient taking differently: Takes as needed.)   loperamide  (IMODIUM ) 2 MG capsule Take 2 capsules (4 mg total) by mouth as needed for diarrhea or loose stools. , then 1 tab with each additional loose stool as needed.  Do not exceed 8 tabs in 24hr period.   medroxyPROGESTERone  (DEPO-PROVERA ) 150 MG/ML injection Inject 150 mg into the muscle every 3 (three) months. Reported on 11/06/2015   ondansetron  (ZOFRAN ) 8 MG tablet Take 1 tablet (8 mg total) by mouth every 8  (eight) hours as needed for nausea or vomiting. Start on the 3rd day after chemotherapy   polyethylene glycol powder (GLYCOLAX/MIRALAX) 17 GM/SCOOP powder Take 17 g by mouth. (Patient taking differently: Take 17 g by mouth. Takes as needed.)   prochlorperazine  (COMPAZINE ) 10 MG tablet Take 1 tablet (10 mg total) by mouth every 6 (six) hours as needed for nausea or vomiting.   Semaglutide , 1 MG/DOSE, (OZEMPIC , 1 MG/DOSE,) 4 MG/3ML SOPN Inject 1 mg into the skin once a week.   valACYclovir  (VALTREX ) 500 MG tablet Take 500 mg by mouth 2 (two) times daily. For 7 days as needed   Blood Glucose Monitoring Suppl (BAYER CONTOUR LINK 2.4) w/Device KIT Use Bayer Contour link meter to check blood sugar 2 times a day   glucose blood test strip Use Bayer Contour test strips to check blood sugar 2 times a day   Insulin  Pen Needle 32G X 4 MM MISC Use 1x a day   Microlet Lancets MISC Use to check blood sugar 2 times a day   No facility-administered encounter medications on file as of 07/14/2024.    ALLERGIES:  Allergies  Allergen Reactions   Ibuprofen Other (See Comments)    Abdominal pains.     FAMILY HISTORY:  Family History  Problem Relation Age of Onset   Hypertension Mother    Diabetes Mother    Anemia Mother    Migraines Mother    Thyroid  cancer Mother 4   Hypertension Maternal Grandmother    Diabetes Maternal Grandmother    Heart disease Maternal Grandfather    Diabetes Maternal Grandfather    Colon cancer Maternal Great-grandfather    Colon cancer Maternal Great-grandfather    Anesthesia problems Neg Hx    BRCA 1/2 Neg Hx    Breast cancer Neg Hx    Ovarian cancer Neg Hx    Endometrial cancer Neg Hx      SOCIAL HISTORY:  Social Connections: Not on file    REVIEW OF SYSTEMS:  + Decreased appetite, fatigue, weight loss, abdominal pain, blood in stool, constipation, diarrhea, dyspareunia, hot flashes, pelvic pain, joint pain, back pain, muscle pain/cramps. Denies fevers,  chills. Denies hearing loss, neck lumps or masses, mouth sores, ringing in ears or voice changes. Denies cough or wheezing.  Denies shortness of breath. Denies chest pain or palpitations. Denies leg swelling. Denies abdominal distention, vomiting. Denies pain with intercourse, dysuria,  frequency, hematuria or incontinence. Denies vaginal bleeding or discharge. Denies itching, rash, or wounds. Denies dizziness, headaches, numbness or seizures. Denies swollen lymph nodes or glands, denies easy bruising or bleeding. Denies anxiety, depression, confusion, or decreased concentration.  Physical Exam:  Vital Signs for this encounter:  Blood pressure 121/71, pulse 98, temperature 98.9 F (37.2 C), temperature source Oral, resp. rate 18, height 5' 7 (1.702 m), weight 158 lb 9.6 oz (71.9 kg), SpO2 98%. Body mass index is 24.84 kg/m. General: Alert, oriented, no acute distress.  HEENT: Normocephalic, atraumatic. Sclera anicteric.  Chest: Clear to auscultation bilaterally. No wheezes, rhonchi, or rales. Cardiovascular: Regular rate and rhythm, no murmurs, rubs, or gallops.  Abdomen: Normoactive bowel sounds. Soft, nondistended, nontender to palpation. No masses or hepatosplenomegaly appreciated. No palpable fluid wave.  Extremities: Grossly normal range of motion. Warm, well perfused. No edema bilaterally.  Skin: No rashes or lesions.  Lymphatics: No cervical, supraclavicular, or inguinal adenopathy.  GU:  Normal external female genitalia. No lesions. No discharge or bleeding.             Bladder/urethra: No lesions.  Some apical prolapse noted.             Vagina: Well-rugated, no lesions.             Cervix: Normal appearing, no lesions.             Uterus: Small, mobile, no parametrial involvement or nodularity.  Uterus moves in conjunction with mass felt posterior within the cul-de-sac.             Adnexa: Mass within the cul-de-sac as described above.  Rectal: Deferred.  LABORATORY AND  RADIOLOGIC DATA:  Outside medical records were reviewed to synthesize the above history, along with the history and physical obtained during the visit.   Lab Results  Component Value Date   WBC 4.6 07/03/2024   HGB 12.5 07/03/2024   HCT 37.6 07/03/2024   PLT 280 07/03/2024   GLUCOSE 103 (H) 07/03/2024   CHOL 153 05/17/2020   TRIG 96.0 05/17/2020   HDL 44.30 05/17/2020   LDLCALC 90 05/17/2020   ALT 9 07/03/2024   AST 15 07/03/2024   NA 141 07/03/2024   K 4.1 07/03/2024   CL 108 07/03/2024   CREATININE 0.69 07/03/2024   BUN 7 07/03/2024   CO2 24 07/03/2024   TSH 1.130 04/25/2019   INR 0.9 06/30/2024   HGBA1C 8.8 (A) 05/17/2020   MICROALBUR 80 04/25/2019

## 2024-07-14 ENCOUNTER — Inpatient Hospital Stay: Attending: Hematology | Admitting: Gynecologic Oncology

## 2024-07-14 ENCOUNTER — Encounter: Payer: Self-pay | Admitting: Gynecologic Oncology

## 2024-07-14 ENCOUNTER — Encounter: Payer: Self-pay | Admitting: Hematology

## 2024-07-14 VITALS — BP 121/71 | HR 98 | Temp 98.9°F | Resp 18 | Ht 67.0 in | Wt 158.6 lb

## 2024-07-14 DIAGNOSIS — Z808 Family history of malignant neoplasm of other organs or systems: Secondary | ICD-10-CM | POA: Diagnosis not present

## 2024-07-14 DIAGNOSIS — D376 Neoplasm of uncertain behavior of liver, gallbladder and bile ducts: Secondary | ICD-10-CM | POA: Insufficient documentation

## 2024-07-14 DIAGNOSIS — R6881 Early satiety: Secondary | ICD-10-CM | POA: Diagnosis not present

## 2024-07-14 DIAGNOSIS — B009 Herpesviral infection, unspecified: Secondary | ICD-10-CM | POA: Diagnosis not present

## 2024-07-14 DIAGNOSIS — N941 Unspecified dyspareunia: Secondary | ICD-10-CM | POA: Diagnosis not present

## 2024-07-14 DIAGNOSIS — Z87410 Personal history of cervical dysplasia: Secondary | ICD-10-CM | POA: Diagnosis not present

## 2024-07-14 DIAGNOSIS — L80 Vitiligo: Secondary | ICD-10-CM | POA: Insufficient documentation

## 2024-07-14 DIAGNOSIS — Z5111 Encounter for antineoplastic chemotherapy: Secondary | ICD-10-CM | POA: Diagnosis present

## 2024-07-14 DIAGNOSIS — C187 Malignant neoplasm of sigmoid colon: Secondary | ICD-10-CM | POA: Insufficient documentation

## 2024-07-14 DIAGNOSIS — Z79624 Long term (current) use of inhibitors of nucleotide synthesis: Secondary | ICD-10-CM | POA: Diagnosis not present

## 2024-07-14 DIAGNOSIS — Z7984 Long term (current) use of oral hypoglycemic drugs: Secondary | ICD-10-CM | POA: Insufficient documentation

## 2024-07-14 DIAGNOSIS — D6481 Anemia due to antineoplastic chemotherapy: Secondary | ICD-10-CM | POA: Insufficient documentation

## 2024-07-14 DIAGNOSIS — Z5189 Encounter for other specified aftercare: Secondary | ICD-10-CM | POA: Insufficient documentation

## 2024-07-14 DIAGNOSIS — R5383 Other fatigue: Secondary | ICD-10-CM | POA: Insufficient documentation

## 2024-07-14 DIAGNOSIS — R21 Rash and other nonspecific skin eruption: Secondary | ICD-10-CM | POA: Insufficient documentation

## 2024-07-14 DIAGNOSIS — N912 Amenorrhea, unspecified: Secondary | ICD-10-CM

## 2024-07-14 DIAGNOSIS — Z7985 Long-term (current) use of injectable non-insulin antidiabetic drugs: Secondary | ICD-10-CM | POA: Insufficient documentation

## 2024-07-14 DIAGNOSIS — E119 Type 2 diabetes mellitus without complications: Secondary | ICD-10-CM | POA: Diagnosis not present

## 2024-07-14 DIAGNOSIS — Z800 Family history of malignant neoplasm of digestive organs: Secondary | ICD-10-CM | POA: Diagnosis not present

## 2024-07-14 DIAGNOSIS — R1900 Intra-abdominal and pelvic swelling, mass and lump, unspecified site: Secondary | ICD-10-CM | POA: Insufficient documentation

## 2024-07-14 DIAGNOSIS — R110 Nausea: Secondary | ICD-10-CM | POA: Diagnosis not present

## 2024-07-14 NOTE — Patient Instructions (Signed)
 It was very nice to meet you today.  I will let Dr. Debby know that we met and that I am available to help with surgery if and when this gets scheduled.  If the uterus is stuck to the colon at all, we will plan on removing the uterus and cervix at the time of your colon surgery.  Please think about whether you would want tubes and ovaries to be removed at the same time if they are free from the mass on your colon.

## 2024-07-17 ENCOUNTER — Encounter: Payer: Self-pay | Admitting: Hematology

## 2024-07-19 MED FILL — Fosaprepitant Dimeglumine For IV Infusion 150 MG (Base Eq): INTRAVENOUS | Qty: 5 | Status: AC

## 2024-07-20 ENCOUNTER — Inpatient Hospital Stay

## 2024-07-20 ENCOUNTER — Inpatient Hospital Stay: Admitting: Dietician

## 2024-07-20 ENCOUNTER — Inpatient Hospital Stay (HOSPITAL_BASED_OUTPATIENT_CLINIC_OR_DEPARTMENT_OTHER): Admitting: Hematology

## 2024-07-20 VITALS — Temp 98.4°F

## 2024-07-20 VITALS — BP 115/89 | HR 100 | Resp 17 | Wt 162.2 lb

## 2024-07-20 DIAGNOSIS — C187 Malignant neoplasm of sigmoid colon: Secondary | ICD-10-CM

## 2024-07-20 DIAGNOSIS — Z5111 Encounter for antineoplastic chemotherapy: Secondary | ICD-10-CM | POA: Diagnosis not present

## 2024-07-20 LAB — CBC WITH DIFFERENTIAL (CANCER CENTER ONLY)
Abs Immature Granulocytes: 0.02 K/uL (ref 0.00–0.07)
Basophils Absolute: 0 K/uL (ref 0.0–0.1)
Basophils Relative: 1 %
Eosinophils Absolute: 0.1 K/uL (ref 0.0–0.5)
Eosinophils Relative: 1 %
HCT: 35.3 % — ABNORMAL LOW (ref 36.0–46.0)
Hemoglobin: 11.7 g/dL — ABNORMAL LOW (ref 12.0–15.0)
Immature Granulocytes: 0 %
Lymphocytes Relative: 24 %
Lymphs Abs: 1.3 K/uL (ref 0.7–4.0)
MCH: 30 pg (ref 26.0–34.0)
MCHC: 33.1 g/dL (ref 30.0–36.0)
MCV: 90.5 fL (ref 80.0–100.0)
Monocytes Absolute: 0.3 K/uL (ref 0.1–1.0)
Monocytes Relative: 5 %
Neutro Abs: 3.8 K/uL (ref 1.7–7.7)
Neutrophils Relative %: 69 %
Platelet Count: 253 K/uL (ref 150–400)
RBC: 3.9 MIL/uL (ref 3.87–5.11)
RDW: 13.7 % (ref 11.5–15.5)
WBC Count: 5.4 K/uL (ref 4.0–10.5)
nRBC: 0 % (ref 0.0–0.2)

## 2024-07-20 LAB — CMP (CANCER CENTER ONLY)
ALT: 11 U/L (ref 0–44)
AST: 17 U/L (ref 15–41)
Albumin: 4.2 g/dL (ref 3.5–5.0)
Alkaline Phosphatase: 153 U/L — ABNORMAL HIGH (ref 38–126)
Anion gap: 8 (ref 5–15)
BUN: 5 mg/dL — ABNORMAL LOW (ref 6–20)
CO2: 24 mmol/L (ref 22–32)
Calcium: 10.8 mg/dL — ABNORMAL HIGH (ref 8.9–10.3)
Chloride: 109 mmol/L (ref 98–111)
Creatinine: 0.66 mg/dL (ref 0.44–1.00)
GFR, Estimated: >60 mL/min (ref >60)
Glucose, Bld: 121 mg/dL — ABNORMAL HIGH (ref 70–99)
Potassium: 4 mmol/L (ref 3.5–5.1)
Sodium: 142 mmol/L (ref 135–145)
Total Bilirubin: <0.2 mg/dL (ref 0.0–1.2)
Total Protein: 7 g/dL (ref 6.5–8.1)

## 2024-07-20 LAB — MAGNESIUM: Magnesium: 2.3 mg/dL (ref 1.7–2.4)

## 2024-07-20 MED ORDER — SODIUM CHLORIDE 0.9 % IV SOLN
2400.0000 mg/m2 | INTRAVENOUS | Status: DC
  Administered 2024-07-20: 4500 mg via INTRAVENOUS
  Filled 2024-07-20: qty 90

## 2024-07-20 MED ORDER — SODIUM CHLORIDE 0.9 % IV SOLN
5.9000 mg/kg | Freq: Once | INTRAVENOUS | Status: AC
  Administered 2024-07-20: 400 mg via INTRAVENOUS
  Filled 2024-07-20: qty 20

## 2024-07-20 MED ORDER — CLINDAMYCIN PHOSPHATE 1 % EX SOLN: Freq: Two times a day (BID) | CUTANEOUS | 1 refills | Status: AC

## 2024-07-20 MED ORDER — FOSAPREPITANT DIMEGLUMINE INJECTION 150 MG
150.0000 mg | Freq: Once | INTRAVENOUS | Status: AC
  Administered 2024-07-20: 150 mg via INTRAVENOUS
  Filled 2024-07-20: qty 150

## 2024-07-20 MED ORDER — SODIUM CHLORIDE 0.9 % IV SOLN: INTRAVENOUS | Status: DC

## 2024-07-20 MED ORDER — DEXTROSE 5 % IV SOLN: INTRAVENOUS | Status: DC

## 2024-07-20 MED ADMIN — IRINOTECAN CHEMO IV INFUSION: 300 mg | INTRAVENOUS | NDC 00009008202

## 2024-07-20 MED ADMIN — OXALIPLATIN CHEMO IV INFUSION: 150 mg | INTRAVENOUS | NDC 60505613206

## 2024-07-20 MED ADMIN — Dexamethasone Sod Phosphate Preservative Free Inj 10 MG/ML: 10 mg | INTRAVENOUS | NDC 00641036721

## 2024-07-20 MED ADMIN — LEUCOVORIN IV INFUSION: 748 mg | INTRAVENOUS | NDC 63323071100

## 2024-07-20 MED ADMIN — Atropine Sulfate IV Soln 1 MG/ML: 0.5 mg | INTRAVENOUS | NDC 00517100101

## 2024-07-20 MED ADMIN — Palonosetron HCl IV Soln 0.25 MG/5ML (Base Equivalent): 0.25 mg | INTRAVENOUS | NDC 60505619301

## 2024-07-20 MED FILL — Oxaliplatin IV Soln 50 MG/10ML: 85.0000 mg/m2 | INTRAVENOUS | Qty: 10 | Status: AC

## 2024-07-20 MED FILL — Palonosetron HCl IV Soln 0.25 MG/5ML (Base Equivalent): 0.2500 mg | INTRAVENOUS | Qty: 5 | Status: AC

## 2024-07-20 MED FILL — Leucovorin Calcium For Inj 500 MG: 400.0000 mg/m2 | INTRAMUSCULAR | Qty: 25 | Status: AC

## 2024-07-20 MED FILL — Atropine Sulfate IV Soln 1 MG/ML: 0.5000 mg | INTRAVENOUS | Qty: 1 | Status: AC

## 2024-07-20 MED FILL — Irinotecan HCl Inj 300 MG/15ML (20 MG/ML): 150.0000 mg/m2 | INTRAVENOUS | Qty: 15 | Status: AC

## 2024-07-20 NOTE — Addendum Note (Signed)
 Encounter addended by: Janice Lynwood BROCKS on: 07/20/2024 11:58 AM  Actions taken: Imaging Exam ended

## 2024-07-20 NOTE — Patient Instructions (Signed)
 [facility] - A DEPT OF [facility]  Discharge Instructions: Thank you for choosing [facility] to provide your oncology and hematology care.   If you have a lab appointment with the Cancer Center, please go directly to the Cancer Center and check in at the registration area.   Wear comfortable clothing and clothing appropriate for easy access to any Portacath or PICC line.   We strive to give you quality time with your provider. You may need to reschedule your appointment if you arrive late (15 or more minutes).  Arriving late affects you and other patients whose appointments are after yours.  Also, if you miss three or more appointments without notifying the office, you may be dismissed from the clinic at the providers discretion.      For prescription refill requests, have your pharmacy contact our office and allow 72 hours for refills to be completed.    Today you received the following chemotherapy and/or immunotherapy agents: Panitumumab (Vectibix), Irinotecan  (Camptosar ), Oxaliplatin  (Eloxatin ), Leucovorin , & Fluorouracil  (Adrucil )       To help prevent nausea and vomiting after your treatment, we encourage you to take your nausea medication as directed.  BELOW ARE SYMPTOMS THAT SHOULD BE REPORTED IMMEDIATELY: *FEVER GREATER THAN 100.4 F (38 C) OR HIGHER *CHILLS OR SWEATING *NAUSEA AND VOMITING THAT IS NOT CONTROLLED WITH YOUR NAUSEA MEDICATION *UNUSUAL SHORTNESS OF BREATH *UNUSUAL BRUISING OR BLEEDING *URINARY PROBLEMS (pain or burning when urinating, or frequent urination) *BOWEL PROBLEMS (unusual diarrhea, constipation, pain near the anus) TENDERNESS IN MOUTH AND THROAT WITH OR WITHOUT PRESENCE OF ULCERS (sore throat, sores in mouth, or a toothache) UNUSUAL RASH, SWELLING OR PAIN  UNUSUAL VAGINAL DISCHARGE OR ITCHING   Items with * indicate a potential emergency and should be followed up as soon as possible or go to the Emergency  Department if any problems should occur.  Please show the CHEMOTHERAPY ALERT CARD or IMMUNOTHERAPY ALERT CARD at check-in to the Emergency Department and triage nurse.  Should you have questions after your visit or need to cancel or reschedule your appointment, please contact [facility] - [facility] DE[facility]  Dept: [phone]  and follow the prompts.  Office hours are 8:00 a.m. to 4:30 p.m. Monday - Friday. Please note that voicemails left after 4:00 p.m. may not be returned until the following business day.  We are closed weekends and major holidays. You have access to a nurse at all times for urgent questions. Please call the main number to the clinic Dept: [phone] and follow the prompts.   For any non-urgent questions, you may also contact your provider using MyChart. We now offer e-Visits for anyone 74 and older to request care online for non-urgent symptoms. For details visit [email or web address].   Also download the MyChart app! Go to the app store, search MyChart, open the app, select [place], and log in with your MyChart username and password.

## 2024-07-20 NOTE — Progress Notes (Signed)
 Per Dr. Lanny- okay to speed up fluorouracil  home pump to run over 44 hours instead of 48 hours. New rate: 3.4 mL/ hr

## 2024-07-20 NOTE — Progress Notes (Signed)
 [facility]   [phone] [phone]) [phone]   Clinic Follow up Note   Patient Care Team: Dwight Trula SQUIBB, MD as PCP - General (Internal Medicine)  Date of Service:  07/20/2024  CHIEF COMPLAINT: f/u of metastatic sigmoid colon cancer  CURRENT THERAPY:  First-line chemotherapy FOLFOXIRI and Vectibix  Oncology History   Cancer of sigmoid colon (HCC)  Probable stage IV with liver metastasis, MMR normal, TMB 3.2, KRAS/NRAS/BRAF wild type, TP53 and APC mutation (+) -Diagnosed in October 2025.  Patient presented with constipation for 4 to 5 months. -Colonoscopy on June 05, 2024 showed a fungating and infiltrative partially obstructing large mass in the sigmoid colon, measuring 10 cm, involving one half of the lumen.  Biopsy confirmed poorly differentiated adenocarcinoma.  MMR normal. -Liver MRI showed 1.9 cm lesion in the duodenum, and 2 additional 1.0 to 1.5 cm mass in the left lobe of liver, concerning for metastasis. -liver biopsy was attempted but not feasible due to location in the dome  -plan to start chemo FOLFOXFIRI on 07/03/2024 with GCSF, plan to add Vectibix from cycle 2   Assessment & Plan Metastatic sigmoid colon cancer Undergoing systemic chemotherapy for metastatic sigmoid colon cancer with expected side effects. Ambulatory and independent with activities of daily living. Multidisciplinary evaluation ongoing with surgery and gynecology regarding possible tumor involvement of uterus and ovary. Imaging after additional cycles will guide further management and potential surgical intervention. - Proceed with cycle 2 of chemotherapy including addition of Vactivix (antibody therapy). - Repeat imaging after cycle 5 to assess response to therapy. - Communicate scan results to gynecology (Dr. Viktoria) and surgery (Dr. Debby) to coordinate further management and potential surgery. - Continue multidisciplinary care with ongoing follow-up.  Chemotherapy  management and adverse effects Experiencing expected chemotherapy-related adverse effects: fatigue, nausea without emesis, and mild diarrhea. Symptoms managed with antiemetics and adjusted Miralax use. At risk for antibody-induced rash and has prior vulvovaginal candidiasis with antibiotics. Using topical pain patches for musculoskeletal discomfort. Metronidazole  is safe for vaginitis as needed. - Instructed on antiemetic use, including Zofran , per provided schedule. - Advised to hold Miralax during diarrhea and resume as tolerated. - Recommended dexamethasone  for up to five days post-chemotherapy for fatigue and nausea. - Prescribed clindamycin gel 60g for antibody-induced rash; instructed to apply twice daily to face, neck, and affected areas. - Recommended over-the-counter hydrocortisone for rash (once daily on face, twice daily on body) if needed. - Discussed possible need for doxycycline if rash is severe; she is aware of risk for vulvovaginal candidiasis and has diflucan  available if needed. - Advised on sun protection (sunscreen, hat) due to increased photosensitivity from antibody therapy. - Reviewed that Salonpas pain patch is safe for musculoskeletal pain. - Confirmed metronidazole  is safe for vaginitis as needed. - Instructed to call for severe rash or other significant adverse effects.  Chemotherapy-induced hypomagnesemia At risk for hypomagnesemia due to Vactivix therapy. Intravenous magnesium supplementation planned as part of chemotherapy protocol, with additional supplementation due to prolonged infusion time. - Administer intravenous magnesium supplementation during chemotherapy as indicated by laboratory values. - Provide additional intravenous magnesium at next pump disconnect visit (Saturday) due to prolonged infusion time today. - Monitor magnesium levels and consider oral supplementation if levels remain low at future visits.  Plan - She tolerated the first cycle  chemotherapy moderately well, and has recovered - Lab reviewed, adequate for treatment, will proceed to second cycle chemo and Vectibix.  Potential side effect of Vectibix and management were reviewed with  her again - Follow-up in 2 weeks before cycle 3 chemo   SUMMARY OF ONCOLOGIC HISTORY: Oncology History  Cancer of sigmoid colon (HCC)  06/05/2024 Cancer Staging   Staging form: Colon and Rectum, AJCC 8th Edition - Clinical stage from 06/05/2024: Stage Unknown (cTX, cN0, cM1) - Signed by Lanny Callander, MD on 06/21/2024 Stage prefix: Initial diagnosis Total positive nodes: 0 Histologic grade (G): G2 Histologic grading system: 4 grade system   06/06/2024 Initial Diagnosis   Cancer of sigmoid colon (HCC)   06/20/2024 Genetic Testing   Negative genetic testing. Report date is 06/20/2024.  The CancerNext-Expanded gene panel offered by [facility] and includes sequencing, rearrangement, and RNA analysis for the following 77 genes: AIP, ALK, APC, ATM, BAP1, BARD1, BMPR1A, BRCA1, BRCA2, BRIP1, CDC73, CDH1, CDK4, CDKN1B, CDKN2A, CEBPA, CHEK2, CTNNA1, DDX41, DICER1, ETV6, FH, FLCN, GATA2, LZTR1, MAX, MBD4, MEN1, MET, MLH1, MSH2, MSH3, MSH6, MUTYH, NF1, NF2, NTHL1, PALB2, PHOX2B, PMS2, POT1, PRKAR1A, PTCH1, PTEN, RAD51C, RAD51D, RB1, RET, RPS20, RUNX1, SDHA, SDHAF2, SDHB, SDHC, SDHD, SMAD4, SMARCA4, SMARCB1, SMARCE1, STK11, SUFU, TMEM127, TP53, TSC1, TSC2, VHL, and WT1 (sequencing and deletion/duplication); AXIN2, CTNNA1, DDX41, EGFR, HOXB13, KIT, MBD4, MITF, MSH3, PDGFRA, POLD1 and POLE (sequencing only); EPCAM and GREM1 (deletion/duplication only). RNA data is routinely analyzed for use in variant interpretation for all genes.    07/03/2024 - 07/03/2024 Chemotherapy   Patient is on Treatment Plan : COLORECTAL FOLFIRINOX + Bevacizumab q14d     07/03/2024 -  Chemotherapy   Patient is on Treatment Plan : COLORECTAL Vectibix + FOLFOXIRI Q14D        Discussed the use of AI scribe software for  clinical note transcription with the patient, who gave verbal consent to proceed.  History of Present Illness Ruth Mckinney is a 48 year old female with metastatic sigmoid colon cancer who presents for follow-up to assess chemotherapy tolerance and ongoing management.  She is on systemic chemotherapy for metastatic sigmoid colon cancer and is on cycle 2 with planned addition of antibody therapy. After cycle 1 she had marked fatigue, weakness, and somnolence for about five days but remained independent with activities of daily living and ambulatory at home. She is not working.  She had intermittent nausea without emesis, controlled with ondansetron  after pump removal and dexamethasone  for up to five days post-chemotherapy for nausea and fatigue.  She had mild diarrhea for several days post-chemotherapy, with two to three loose stools per day. She held Miralax during this period and restarted it after the diarrhea resolved. She did not have severe diarrhea.  She is at risk for dermatologic toxicity with the planned antibody therapy. She has previously used clindamycin gel and doxycycline for skin issues and keeps fluconazole  available due to prior antibiotic-associated candidiasis.  She uses a Salonpas patch for a painful cervical mass, which gives temporary relief. She has chronic neck and shoulder myalgia and has used topical agents in the past. She intermittently uses metronidazole  for vaginitis and has confirmed it is compatible with her current regimen.  She recently saw surgery and gynecology. The gynecologist noted possible tumor involvement of the uterus and non-visualization of the ovary.     All other systems were reviewed with the patient and are negative.  MEDICAL HISTORY:  Past Medical History:  Diagnosis Date   Abnormal Pap smear    Anemia    Diabetes mellitus (HCC)    Family history of thyroid  cancer    Mother at 21   Genital herpes     SURGICAL HISTORY: Past  Surgical History:  Procedure Laterality Date   ADENOIDECTOMY     CESAREAN SECTION     COLPOSCOPY     IR IMAGING GUIDED PORT INSERTION  06/30/2024   LEEP     MOUTH SURGERY      I have reviewed the social history and family history with the patient and they are unchanged from previous note.  ALLERGIES:  is allergic to ibuprofen.  MEDICATIONS:  Current Outpatient Medications  Medication Sig Dispense Refill   atorvastatin (LIPITOR) 20 MG tablet      clobetasol ointment (TEMOVATE) 0.05 % APPLY OINTMENT TOPICALLY TWICE DAILY FOR UP TO 2 WEEKS CONSECUTIVE THEN SWITCH TO TACROLIMUS FOR A WEEK, THEN ROTATE BACK (Patient taking differently: Takes as needed.)     desonide (DESOWEN) 0.05 % ointment APPLY OINTMENT TOPICALLY TWICE DAILY (Patient taking differently: Takes as needed.)     dexamethasone  (DECADRON ) 4 MG tablet Take 2 tablets (8 mg total) by mouth 2 (two) times daily with a meal. Take 2 tablets (8mg ) daily x3 days starting the day after chemotherapy.  Take with food. 30 tablet 1   hydroquinone 4 % cream APPLY CREAM TOPICALLY TO AFFECTED AREA ONCE DAILY FOR UP TO 3 MONTHS (Patient taking differently: Takes as needed.)     insulin  degludec (TRESIBA  FLEXTOUCH) 200 UNIT/ML FlexTouch Pen Inject 36 Units into the skin daily. 9 mL 3   ketoconazole (NIZORAL) 2 % cream APPLY CREAM TOPICALLY TWICE DAILY (Patient taking differently: Takes as needed.)     loperamide  (IMODIUM ) 2 MG capsule Take 2 capsules (4 mg total) by mouth as needed for diarrhea or loose stools. , then 1 tab with each additional loose stool as needed.  Do not exceed 8 tabs in 24hr period. 60 capsule 1   medroxyPROGESTERone  (DEPO-PROVERA ) 150 MG/ML injection Inject 150 mg into the muscle every 3 (three) months. Reported on 11/06/2015     ondansetron  (ZOFRAN ) 8 MG tablet Take 1 tablet (8 mg total) by mouth every 8 (eight) hours as needed for nausea or vomiting. Start on the 3rd day after chemotherapy 20 tablet 1   polyethylene glycol  powder (GLYCOLAX/MIRALAX) 17 GM/SCOOP powder Take 17 g by mouth. (Patient taking differently: Take 17 g by mouth. Takes as needed.)     prochlorperazine  (COMPAZINE ) 10 MG tablet Take 1 tablet (10 mg total) by mouth every 6 (six) hours as needed for nausea or vomiting. 30 tablet 0   Semaglutide , 1 MG/DOSE, (OZEMPIC , 1 MG/DOSE,) 4 MG/3ML SOPN Inject 1 mg into the skin once a week. 9 mL 2   valACYclovir  (VALTREX ) 500 MG tablet Take 500 mg by mouth 2 (two) times daily. For 7 days as needed     clindamycin (CLEOCIN T) 1 % external solution Apply topically 2 (two) times daily. 60 mL 1   No current facility-administered medications for this visit.    PHYSICAL EXAMINATION: ECOG PERFORMANCE STATUS: 1 - Symptomatic but completely ambulatory  Vitals:   07/20/24 1013  BP: 115/89  Pulse: 100  Resp: 17  SpO2: 100%   Wt Readings from Last 3 Encounters:  07/20/24 162 lb 3.2 oz (73.6 kg)  07/14/24 158 lb 9.6 oz (71.9 kg)  07/03/24 162 lb 6.4 oz (73.7 kg)     GENERAL:alert, no distress and comfortable SKIN: skin color, texture, turgor are normal, no rashes or significant lesions EYES: normal, Conjunctiva are pink and non-injected, sclera clear NECK: supple, thyroid  normal size, non-tender, without nodularity LYMPH:  no palpable lymphadenopathy in the cervical, axillary  LUNGS:  clear to auscultation and percussion with normal breathing effort HEART: regular rate & rhythm and no murmurs and no lower extremity edema ABDOMEN:abdomen soft, non-tender and normal bowel sounds Musculoskeletal:no cyanosis of digits and no clubbing  NEURO: alert & oriented x 3 with fluent speech, no focal motor/sensory deficits  Physical Exam   LABORATORY DATA:  I have reviewed the data as listed    Latest Ref Rng & Units 07/20/2024    9:54 AM 07/03/2024    7:45 AM 06/30/2024    9:46 AM  CBC  WBC 4.0 - 10.5 K/uL 5.4  4.6  5.4   Hemoglobin 12.0 - 15.0 g/dL 88.2  87.4  87.9   Hematocrit 36.0 - 46.0 % 35.3  37.6   36.7   Platelets 150 - 400 K/uL 253  280  306         Latest Ref Rng & Units 07/03/2024    7:45 AM 06/07/2024    4:27 PM 05/17/2020    9:25 AM  CMP  Glucose 70 - 99 mg/dL 896  880  785   BUN 6 - 20 mg/dL 7  <5  10   Creatinine 0.44 - 1.00 mg/dL 9.30  9.23  9.14   Sodium 135 - 145 mmol/L 141  140  137   Potassium 3.5 - 5.1 mmol/L 4.1  4.3  4.2   Chloride 98 - 111 mmol/L 108  105  105   CO2 22 - 32 mmol/L 24  24  25    Calcium  8.9 - 10.3 mg/dL 88.8  88.5  89.5   Total Protein 6.5 - 8.1 g/dL 7.3  7.7  7.0   Total Bilirubin 0.0 - 1.2 mg/dL 0.4  0.4  0.3   Alkaline Phos 38 - 126 U/L 142  129  94   AST 15 - 41 U/L 15  19  11    ALT 0 - 44 U/L 9  8  20        RADIOGRAPHIC STUDIES: I have personally reviewed the radiological images as listed and agreed with the findings in the report. No results found.    Orders Placed This Encounter  Procedures   Magnesium    Standing Status:   Future    Number of Occurrences:   1    Expected Date:   07/20/2024    Expiration Date:   07/20/2025   Magnesium    Standing Status:   Future    Expected Date:   08/01/2024    Expiration Date:   08/01/2025   Magnesium    Standing Status:   Future    Expected Date:   08/15/2024    Expiration Date:   08/15/2025   CBC with Differential (Cancer Center Only)    Standing Status:   Future    Expected Date:   08/15/2024    Expiration Date:   08/15/2025   CMP (Cancer Center only)    Standing Status:   Future    Expected Date:   08/15/2024    Expiration Date:   08/15/2025   Magnesium    Standing Status:   Future    Expected Date:   08/29/2024    Expiration Date:   08/29/2025   CBC with Differential (Cancer Center Only)    Standing Status:   Future    Expected Date:   08/29/2024    Expiration Date:   08/29/2025   CMP (Cancer Center only)    Standing Status:   Future    Expected Date:  08/29/2024    Expiration Date:   08/29/2025   All questions were answered. The patient knows to call the clinic with any problems,  questions or concerns. No barriers to learning was detected. The total time spent in the appointment was 30 minutes, including review of chart and various tests results, discussions about plan of care and coordination of care plan     Onita Mattock, MD 07/20/2024

## 2024-07-20 NOTE — Progress Notes (Signed)
 Nutrition Follow-up:  Patient with metastatic colon cancer to liver and possible tumor involvement of uterus and ovary. Gynecology/surgery following. Patient started [facility] 11/24. Vectibix added today.   Met with patient and mom in infusion. Patient reports tolerating first treatment well overall. Did have mild nausea controlled with antiemetics. Patient complains of early satiety with po. Eating 2-3 small meals (cheese toast, applesauce, eggs, cereal, peeled apples with peanut butter, carrots, peeled/seeded cucumbers). Notes appetite got better on Friday. Patient forgot to take ozempic . She had mild cold sensitivity with touch. Resolved after pump d/c. Patient reports episodes of diarrhea after infusion. Stopped taking miralax during this time.    Medications: reviewed   Labs: glucose 121, BUN 5, Ca 10.8, alk phos 153  Anthropometrics: Wt 162 lb 3.2 oz today   11/24 - 162 lb 6.4 oz   NUTRITION DIAGNOSIS: Food and nutrition related knowledge deficit improved    INTERVENTION:  Encouraged increasing small meals - aim for 4-6/day Recommend protein foods at every meal Consider d/c ozempic  during treatment given decreased appetite - currently on 1 mg/week    MONITORING, EVALUATION, GOAL: wt trends, intake   NEXT VISIT: Thursday January 8 during infusion

## 2024-07-20 NOTE — Assessment & Plan Note (Signed)
 Probable stage IV with liver metastasis, MMR normal, TMB 3.2, KRAS/NRAS/BRAF wild type, TP53 and APC mutation (+) -Diagnosed in October 2025.  Patient presented with constipation for 4 to 5 months. -Colonoscopy on June 05, 2024 showed a fungating and infiltrative partially obstructing large mass in the sigmoid colon, measuring 10 cm, involving one half of the lumen.  Biopsy confirmed poorly differentiated adenocarcinoma.  MMR normal. -Liver MRI showed 1.9 cm lesion in the duodenum, and 2 additional 1.0 to 1.5 cm mass in the left lobe of liver, concerning for metastasis. -liver biopsy was attempted but not feasible due to location in the dome  -plan to start chemo FOLFOXFIRI on 07/03/2024 with GCSF, plan to add Vectibix from cycle 2

## 2024-07-22 ENCOUNTER — Inpatient Hospital Stay

## 2024-07-22 VITALS — BP 121/86 | HR 102 | Temp 97.3°F | Resp 18

## 2024-07-22 DIAGNOSIS — Z5111 Encounter for antineoplastic chemotherapy: Secondary | ICD-10-CM | POA: Diagnosis not present

## 2024-07-22 DIAGNOSIS — C187 Malignant neoplasm of sigmoid colon: Secondary | ICD-10-CM

## 2024-07-22 MED ORDER — PEGFILGRASTIM INJECTION 6 MG/0.6ML ~~LOC~~
6.0000 mg | PREFILLED_SYRINGE | Freq: Once | SUBCUTANEOUS | Status: AC
  Administered 2024-07-22: 6 mg via SUBCUTANEOUS
  Filled 2024-07-22: qty 0.6

## 2024-07-22 MED ORDER — MAGNESIUM SULFATE 2 GM/50ML IV SOLN
2.0000 g | Freq: Once | INTRAVENOUS | Status: AC
  Administered 2024-07-22: 2 g via INTRAVENOUS
  Filled 2024-07-22: qty 50

## 2024-07-22 NOTE — Patient Instructions (Signed)
 Magnesium Sulfate Injection What is this medication? MAGNESIUM SULFATE (mag NEE zee um SUL fate) prevents and treats low levels of magnesium in your body. It may also be used to prevent and treat seizures during pregnancy in people with high blood pressure disorders, such as preeclampsia or eclampsia. Magnesium plays an important role in maintaining the health of your muscles and nervous system. This medicine may be used for other purposes; ask your health care provider or pharmacist if you have questions. What should I tell my care team before I take this medication? They need to know if you have any of these conditions: Heart disease History of irregular heart beat Kidney disease An unusual or allergic reaction to magnesium sulfate, medications, foods, dyes, or preservatives Pregnant or trying to get pregnant Breast-feeding How should I use this medication? This medication is for infusion into a vein. It is given in a hospital or clinic setting. Talk to your care team about the use of this medication in children. While this medication may be prescribed for selected conditions, precautions do apply. Overdosage: If you think you have taken too much of this medicine contact a poison control center or emergency room at once. NOTE: This medicine is only for you. Do not share this medicine with others. What if I miss a dose? This does not apply. What may interact with this medication? Certain medications for anxiety or sleep Certain medications for seizures, such phenobarbital Digoxin Medications that relax muscles for surgery Narcotic medications for pain This list may not describe all possible interactions. Give your health care provider a list of all the medicines, herbs, non-prescription drugs, or dietary supplements you use. Also tell them if you smoke, drink alcohol, or use illegal drugs. Some items may interact with your medicine. What should I watch for while using this  medication? Your condition will be monitored carefully while you are receiving this medication. You may need blood work done while you are receiving this medication. What side effects may I notice from receiving this medication? Side effects that you should report to your care team as soon as possible: Allergic reactions--skin rash, itching, hives, swelling of the face, lips, tongue, or throat High magnesium level--confusion, drowsiness, facial flushing, redness, sweating, muscle weakness, fast or irregular heartbeat, trouble breathing Low blood pressure--dizziness, feeling faint or lightheaded, blurry vision Side effects that usually do not require medical attention (report to your care team if they continue or are bothersome): Headache Nausea This list may not describe all possible side effects. Call your doctor for medical advice about side effects. You may report side effects to FDA at 1-800-FDA-1088. Where should I keep my medication? This medication is given in a hospital or clinic and will not be stored at home. NOTE: This sheet is a summary. It may not cover all possible information. If you have questions about this medicine, talk to your doctor, pharmacist, or health care provider.  2024 Elsevier/Gold Standard (2021-04-09 00:00:00)

## 2024-07-28 ENCOUNTER — Other Ambulatory Visit: Payer: Self-pay

## 2024-07-28 ENCOUNTER — Telehealth: Payer: Self-pay

## 2024-07-28 ENCOUNTER — Other Ambulatory Visit: Payer: Self-pay | Admitting: Hematology

## 2024-07-28 MED ORDER — FLUCONAZOLE 100 MG PO TABS: 100.0000 mg | ORAL_TABLET | Freq: Every day | ORAL | 0 refills | Status: DC

## 2024-07-28 MED ORDER — DOXYCYCLINE HYCLATE 100 MG PO TABS: 100.0000 mg | ORAL_TABLET | Freq: Two times a day (BID) | ORAL | 2 refills | Status: AC

## 2024-07-28 NOTE — Telephone Encounter (Signed)
 Pt called stating she's on Panitumumab  and her rash has gotten worse.  Pt stated she's using the clindamycin  gel that Dr Lanny prescribed but is not getting better.  Pt stated while in clinic with Dr Lanny stated she would prescribe doxycycline  if the rash got worse.  Pt also requested if Diflucan  could also be prescribed for prophylactic yeast infection prevention.  Stated that the pt could use Monistat for the yeast infection.  Pt confirmed to sent prescription/s to her preferred pharmacy [facility] on [place], [place].  Notified Dr Lanny of the pt's call.

## 2024-08-01 ENCOUNTER — Inpatient Hospital Stay (HOSPITAL_BASED_OUTPATIENT_CLINIC_OR_DEPARTMENT_OTHER): Admitting: Hematology

## 2024-08-01 ENCOUNTER — Inpatient Hospital Stay

## 2024-08-01 VITALS — BP 121/85 | HR 100 | Temp 98.2°F | Resp 17 | Wt 158.0 lb

## 2024-08-01 DIAGNOSIS — Z5111 Encounter for antineoplastic chemotherapy: Secondary | ICD-10-CM | POA: Diagnosis not present

## 2024-08-01 DIAGNOSIS — D500 Iron deficiency anemia secondary to blood loss (chronic): Secondary | ICD-10-CM

## 2024-08-01 DIAGNOSIS — C187 Malignant neoplasm of sigmoid colon: Secondary | ICD-10-CM

## 2024-08-01 LAB — CMP (CANCER CENTER ONLY)
ALT: 15 U/L (ref 0–44)
AST: 14 U/L — ABNORMAL LOW (ref 15–41)
Albumin: 4.2 g/dL (ref 3.5–5.0)
Alkaline Phosphatase: 172 U/L — ABNORMAL HIGH (ref 38–126)
Anion gap: 10 (ref 5–15)
BUN: <5 mg/dL — ABNORMAL LOW (ref 6–20)
CO2: 24 mmol/L (ref 22–32)
Calcium: 10.5 mg/dL — ABNORMAL HIGH (ref 8.9–10.3)
Chloride: 106 mmol/L (ref 98–111)
Creatinine: 0.64 mg/dL (ref 0.44–1.00)
GFR, Estimated: >60 mL/min
Glucose, Bld: 182 mg/dL — ABNORMAL HIGH (ref 70–99)
Potassium: 3.7 mmol/L (ref 3.5–5.1)
Sodium: 140 mmol/L (ref 135–145)
Total Bilirubin: <0.2 mg/dL (ref 0.0–1.2)
Total Protein: 6.7 g/dL (ref 6.5–8.1)

## 2024-08-01 LAB — CBC WITH DIFFERENTIAL (CANCER CENTER ONLY)
Abs Immature Granulocytes: 0.04 K/uL (ref 0.00–0.07)
Basophils Absolute: 0 K/uL (ref 0.0–0.1)
Basophils Relative: 0 %
Eosinophils Absolute: 0.1 K/uL (ref 0.0–0.5)
Eosinophils Relative: 1 %
HCT: 33.1 % — ABNORMAL LOW (ref 36.0–46.0)
Hemoglobin: 11.4 g/dL — ABNORMAL LOW (ref 12.0–15.0)
Immature Granulocytes: 1 %
Lymphocytes Relative: 30 %
Lymphs Abs: 1.5 K/uL (ref 0.7–4.0)
MCH: 30.6 pg (ref 26.0–34.0)
MCHC: 34.4 g/dL (ref 30.0–36.0)
MCV: 89 fL (ref 80.0–100.0)
Monocytes Absolute: 0.3 K/uL (ref 0.1–1.0)
Monocytes Relative: 7 %
Neutro Abs: 3 K/uL (ref 1.7–7.7)
Neutrophils Relative %: 61 %
Platelet Count: 209 K/uL (ref 150–400)
RBC: 3.72 MIL/uL — ABNORMAL LOW (ref 3.87–5.11)
RDW: 13.9 % (ref 11.5–15.5)
WBC Count: 5 K/uL (ref 4.0–10.5)
nRBC: 0 % (ref 0.0–0.2)

## 2024-08-01 LAB — CEA (ACCESS): CEA (CHCC): 6.15 ng/mL — ABNORMAL HIGH (ref 0.00–5.00)

## 2024-08-01 LAB — MAGNESIUM: Magnesium: 1.7 mg/dL (ref 1.7–2.4)

## 2024-08-01 LAB — FERRITIN: Ferritin: 152 ng/mL (ref 11–307)

## 2024-08-01 MED FILL — Fosaprepitant Dimeglumine For IV Infusion 150 MG (Base Eq): INTRAVENOUS | Qty: 5 | Status: AC

## 2024-08-01 NOTE — Progress Notes (Signed)
 Per Dr. Lanny, ok to move Mg 2g to D3 pump d/c d/t chair time per nursing's request.  Candy Glatter, PharmD, MBA

## 2024-08-01 NOTE — Progress Notes (Signed)
 " [facility]   [phone] [phone]   Clinic Follow up Note   Patient Care Team: Dwight Trula SQUIBB, MD as PCP - General (Internal Medicine)  Date of Service:  08/01/2024  CHIEF COMPLAINT: f/u of colon cancer   CURRENT THERAPY:  First-line chemotherapy FOLFOXIRI and Vectibix  every 2 weeks  Oncology History   Cancer of sigmoid colon (HCC) Probable stage IV with liver metastasis, MMR normal, TMB 3.2, KRAS/NRAS/BRAF wild type, TP53 and APC mutation (+) -Diagnosed in October 2025.  Patient presented with constipation for 4 to 5 months. -Colonoscopy on June 05, 2024 showed a fungating and infiltrative partially obstructing large mass in the sigmoid colon, measuring 10 cm, involving one half of the lumen.  Biopsy confirmed poorly differentiated adenocarcinoma.  MMR normal. -Liver MRI showed 1.9 cm lesion in the duodenum, and 2 additional 1.0 to 1.5 cm mass in the left lobe of liver, concerning for metastasis. -liver biopsy was attempted but not feasible due to location in the dome  -She started chemo FOLFOXFIRI on 07/03/2024 with GCSF, Vectibix  added from cycle 2   Assessment & Plan Metastatic sigmoid colon cancer She is receiving active chemotherapy for metastatic sigmoid colon cancer, including Vectibix . Imaging is planned after additional cycles to assess therapeutic response. - Scheduled cycle three chemotherapy for August 02, 2024. - Vectibix  infusion and IV magnesium  supplementation ordered. - Planned interval imaging after cycle five or six to evaluate response. - Scheduled follow-up for next cycle on August 17, 2024 with nurse practitioner. - Reviewed treatment schedule through early February.  Chemotherapy-induced skin eruption She developed a pruritic and painful eruption on her chest and nose. The eruption is anticipated to worsen after the next cycle but typically stabilizes after two to three cycles. Oral doxycycline  and topical  clindamycin  have been initiated; topical steroids are available for symptom management. - Continued oral doxycycline  and topical clindamycin  solution. - Recommended over-the-counter hydrocortisone for facial rash. - Advised use of clobetasol ointment for chest rash as previously prescribed by dermatology. - Offered stronger topical steroids (Kenalog, Betagram) for chest if symptoms worsen. - Provided anticipatory guidance regarding expected rash progression and stabilization.  Chemotherapy-induced anemia She has very mild anemia secondary to chemotherapy, with stable blood counts and no symptoms requiring intervention.  Chemotherapy-induced hypomagnesemia She has mild hypomagnesemia (magnesium  1.7 mmol/L), likely related to Vectibix . She receives IV magnesium  supplementation during infusions and does not currently require oral supplementation. - Administered IV magnesium  supplementation with chemotherapy. - If magnesium  level declines further, will prescribe oral magnesium  supplementation. - Discussed option for over-the-counter magnesium  if needed.  Plan -she overall tolerated last cycle chemotherapy well - Lab reviewed, will proceed to treatment today - Follow-up in 2 weeks before next cycle chemo   SUMMARY OF ONCOLOGIC HISTORY: Oncology History  Cancer of sigmoid colon (HCC)  06/05/2024 Cancer Staging   Staging form: Colon and Rectum, AJCC 8th Edition - Clinical stage from 06/05/2024: Stage Unknown (cTX, cN0, cM1) - Signed by Lanny Callander, MD on 06/21/2024 Stage prefix: Initial diagnosis Total positive nodes: 0 Histologic grade (G): G2 Histologic grading system: 4 grade system   06/06/2024 Initial Diagnosis   Cancer of sigmoid colon (HCC)   06/20/2024 Genetic Testing   Negative genetic testing. Report date is 06/20/2024.  The CancerNext-Expanded gene panel offered by [facility] and includes sequencing, rearrangement, and RNA analysis for the following 77 genes: AIP, ALK,  APC, ATM, BAP1, BARD1, BMPR1A, BRCA1, BRCA2, BRIP1, CDC73, CDH1, CDK4, CDKN1B, CDKN2A, CEBPA, CHEK2, CTNNA1,  DDX41, DICER1, ETV6, FH, FLCN, GATA2, LZTR1, MAX, MBD4, MEN1, MET, MLH1, MSH2, MSH3, MSH6, MUTYH, NF1, NF2, NTHL1, PALB2, PHOX2B, PMS2, POT1, PRKAR1A, PTCH1, PTEN, RAD51C, RAD51D, RB1, RET, RPS20, RUNX1, SDHA, SDHAF2, SDHB, SDHC, SDHD, SMAD4, SMARCA4, SMARCB1, SMARCE1, STK11, SUFU, TMEM127, TP53, TSC1, TSC2, VHL, and WT1 (sequencing and deletion/duplication); AXIN2, CTNNA1, DDX41, EGFR, HOXB13, KIT, MBD4, MITF, MSH3, PDGFRA, POLD1 and POLE (sequencing only); EPCAM and GREM1 (deletion/duplication only). RNA data is routinely analyzed for use in variant interpretation for all genes.    07/03/2024 - 07/03/2024 Chemotherapy   Patient is on Treatment Plan : COLORECTAL FOLFIRINOX + Bevacizumab q14d     07/03/2024 -  Chemotherapy   Patient is on Treatment Plan : COLORECTAL Vectibix  + FOLFOXIRI Q14D        Discussed the use of AI scribe software for clinical note transcription with the patient, who gave verbal consent to proceed.  History of Present Illness Quantasia Stegner is a 48 year old female with metastatic sigmoid colon cancer undergoing chemotherapy who presents for follow-up prior to her next treatment cycle.  She is scheduled for her third chemotherapy cycle with her second dose of Vectibix  tomorrow. She has tolerated treatment without persistent neuropathy but notes cold-induced paresthesia that worsened during a recent power outage, resolved with warming, and does not occur without cold exposure.  She has a pruritic, sometimes painful papular rash mainly on the chest and nose, worsened by contact with clothing. She started oral doxycycline  and topical clindamycin  per dermatology. She asked about using over-the-counter hydrocortisone and has clobetasol ointment at home from prior vitiligo treatment. The rash minimally involves the face.  Her most recent magnesium  was 1.7 mg/dL and she  is receiving intravenous magnesium . She noted a blood glucose of 182 mg/dL today after eating. She has no other new treatment-related symptoms or concerns.     All other systems were reviewed with the patient and are negative.  MEDICAL HISTORY:  Past Medical History:  Diagnosis Date   Abnormal Pap smear    Anemia    Diabetes mellitus (HCC)    Family history of thyroid  cancer    Mother at 79   Genital herpes     SURGICAL HISTORY: Past Surgical History:  Procedure Laterality Date   ADENOIDECTOMY     CESAREAN SECTION     COLPOSCOPY     IR IMAGING GUIDED PORT INSERTION  06/30/2024   LEEP     MOUTH SURGERY      I have reviewed the social history and family history with the patient and they are unchanged from previous note.  ALLERGIES:  is allergic to ibuprofen.  MEDICATIONS:  Current Outpatient Medications  Medication Sig Dispense Refill   atorvastatin (LIPITOR) 20 MG tablet      clindamycin  (CLEOCIN  T) 1 % external solution Apply topically 2 (two) times daily. 60 mL 1   clobetasol ointment (TEMOVATE) 0.05 % APPLY OINTMENT TOPICALLY TWICE DAILY FOR UP TO 2 WEEKS CONSECUTIVE THEN SWITCH TO TACROLIMUS FOR A WEEK, THEN ROTATE BACK (Patient taking differently: Takes as needed.)     desonide (DESOWEN) 0.05 % ointment APPLY OINTMENT TOPICALLY TWICE DAILY (Patient taking differently: Takes as needed.)     dexamethasone  (DECADRON ) 4 MG tablet Take 2 tablets (8 mg total) by mouth 2 (two) times daily with a meal. Take 2 tablets (8mg ) daily x3 days starting the day after chemotherapy.  Take with food. 30 tablet 1   doxycycline  (VIBRA -TABS) 100 MG tablet Take 1 tablet (100 mg total) by mouth 2 (  two) times daily. 60 tablet 2   fluconazole  (DIFLUCAN ) 100 MG tablet Take 1 tablet (100 mg total) by mouth daily. Take once as needed for vaginal yeast infection from oral antibiotics. 3 tablet 0   hydroquinone 4 % cream APPLY CREAM TOPICALLY TO AFFECTED AREA ONCE DAILY FOR UP TO 3 MONTHS (Patient  taking differently: Takes as needed.)     insulin  degludec (TRESIBA  FLEXTOUCH) 200 UNIT/ML FlexTouch Pen Inject 36 Units into the skin daily. 9 mL 3   ketoconazole (NIZORAL) 2 % cream APPLY CREAM TOPICALLY TWICE DAILY (Patient taking differently: Takes as needed.)     loperamide  (IMODIUM ) 2 MG capsule Take 2 capsules (4 mg total) by mouth as needed for diarrhea or loose stools. , then 1 tab with each additional loose stool as needed.  Do not exceed 8 tabs in 24hr period. 60 capsule 1   medroxyPROGESTERone  (DEPO-PROVERA ) 150 MG/ML injection Inject 150 mg into the muscle every 3 (three) months. Reported on 11/06/2015     ondansetron  (ZOFRAN ) 8 MG tablet Take 1 tablet (8 mg total) by mouth every 8 (eight) hours as needed for nausea or vomiting. Start on the 3rd day after chemotherapy 20 tablet 1   polyethylene glycol powder (GLYCOLAX/MIRALAX) 17 GM/SCOOP powder Take 17 g by mouth. (Patient taking differently: Take 17 g by mouth. Takes as needed.)     prochlorperazine  (COMPAZINE ) 10 MG tablet Take 1 tablet (10 mg total) by mouth every 6 (six) hours as needed for nausea or vomiting. 30 tablet 0   Semaglutide , 1 MG/DOSE, (OZEMPIC , 1 MG/DOSE,) 4 MG/3ML SOPN Inject 1 mg into the skin once a week. 9 mL 2   valACYclovir  (VALTREX ) 500 MG tablet Take 500 mg by mouth 2 (two) times daily. For 7 days as needed     No current facility-administered medications for this visit.    PHYSICAL EXAMINATION: ECOG PERFORMANCE STATUS: 1 - Symptomatic but completely ambulatory  Vitals:   08/01/24 1003 08/01/24 1004  BP: (!) 123/92 121/85  Pulse: 100   Resp: 17   Temp: 98.2 F (36.8 C)   SpO2: 100%    Wt Readings from Last 3 Encounters:  08/01/24 158 lb (71.7 kg)  07/20/24 162 lb 3.2 oz (73.6 kg)  07/14/24 158 lb 9.6 oz (71.9 kg)     GENERAL:alert, no distress and comfortable SKIN: skin color, texture, turgor are normal, (+) diffuse acne like to rash on face, neck and upper chest EYES: normal, Conjunctiva are  pink and non-injected, sclera clear NECK: supple, thyroid  normal size, non-tender, without nodularity LYMPH:  no palpable lymphadenopathy in the cervical, axillary  LUNGS: clear to auscultation and percussion with normal breathing effort HEART: regular rate & rhythm and no murmurs and no lower extremity edema ABDOMEN:abdomen soft, non-tender and normal bowel sounds Musculoskeletal:no cyanosis of digits and no clubbing  NEURO: alert & oriented x 3 with fluent speech, no focal motor/sensory deficits  Physical Exam    LABORATORY DATA:  I have reviewed the data as listed    Latest Ref Rng & Units 08/01/2024    9:22 AM 07/20/2024    9:54 AM 07/03/2024    7:45 AM  CBC  WBC 4.0 - 10.5 K/uL 5.0  5.4  4.6   Hemoglobin 12.0 - 15.0 g/dL 88.5  88.2  87.4   Hematocrit 36.0 - 46.0 % 33.1  35.3  37.6   Platelets 150 - 400 K/uL 209  253  280         Latest Ref  Rng & Units 08/01/2024    9:22 AM 07/20/2024    9:54 AM 07/03/2024    7:45 AM  CMP  Glucose 70 - 99 mg/dL 817  878  896   BUN 6 - 20 mg/dL 5  5  7    Creatinine 0.44 - 1.00 mg/dL 9.35  9.33  9.30   Sodium 135 - 145 mmol/L 140  142  141   Potassium 3.5 - 5.1 mmol/L 3.7  4.0  4.1   Chloride 98 - 111 mmol/L 106  109  108   CO2 22 - 32 mmol/L 24  24  24    Calcium  8.9 - 10.3 mg/dL 89.4  89.1  88.8   Total Protein 6.5 - 8.1 g/dL 6.7  7.0  7.3   Total Bilirubin 0.0 - 1.2 mg/dL <9.7  <9.7  0.4   Alkaline Phos 38 - 126 U/L 172  153  142   AST 15 - 41 U/L 14  17  15    ALT 0 - 44 U/L 15  11  9        RADIOGRAPHIC STUDIES: I have personally reviewed the radiological images as listed and agreed with the findings in the report. No results found.    Orders Placed This Encounter  Procedures   Magnesium     Standing Status:   Future    Expected Date:   09/13/2024    Expiration Date:   09/13/2025   CBC with Differential (Cancer Center Only)    Standing Status:   Future    Expected Date:   09/13/2024    Expiration Date:   09/13/2025   CMP  (Cancer Center only)    Standing Status:   Future    Expected Date:   09/13/2024    Expiration Date:   09/13/2025   All questions were answered. The patient knows to call the clinic with any problems, questions or concerns. No barriers to learning was detected. The total time spent in the appointment was 25 minutes, including review of chart and various tests results, discussions about plan of care and coordination of care plan     Onita Mattock, MD 08/01/2024     "

## 2024-08-01 NOTE — Assessment & Plan Note (Signed)
 Probable stage IV with liver metastasis, MMR normal, TMB 3.2, KRAS/NRAS/BRAF wild type, TP53 and APC mutation (+) -Diagnosed in October 2025.  Patient presented with constipation for 4 to 5 months. -Colonoscopy on June 05, 2024 showed a fungating and infiltrative partially obstructing large mass in the sigmoid colon, measuring 10 cm, involving one half of the lumen.  Biopsy confirmed poorly differentiated adenocarcinoma.  MMR normal. -Liver MRI showed 1.9 cm lesion in the duodenum, and 2 additional 1.0 to 1.5 cm mass in the left lobe of liver, concerning for metastasis. -liver biopsy was attempted but not feasible due to location in the dome  -She started chemo FOLFOXFIRI on 07/03/2024 with GCSF, Vectibix  added from cycle 2

## 2024-08-02 ENCOUNTER — Inpatient Hospital Stay

## 2024-08-02 VITALS — BP 120/85 | HR 100 | Temp 99.0°F | Resp 19

## 2024-08-02 DIAGNOSIS — Z5111 Encounter for antineoplastic chemotherapy: Secondary | ICD-10-CM | POA: Diagnosis not present

## 2024-08-02 DIAGNOSIS — C187 Malignant neoplasm of sigmoid colon: Secondary | ICD-10-CM

## 2024-08-02 MED ORDER — ATROPINE SULFATE 1 MG/ML IV SOLN
0.5000 mg | Freq: Once | INTRAVENOUS | Status: AC | PRN
  Administered 2024-08-02: 0.5 mg via INTRAVENOUS
  Filled 2024-08-02: qty 1

## 2024-08-02 MED ORDER — SODIUM CHLORIDE 0.9 % IV SOLN
2400.0000 mg/m2 | INTRAVENOUS | Status: DC
  Administered 2024-08-02: 4500 mg via INTRAVENOUS
  Filled 2024-08-02: qty 90

## 2024-08-02 MED ORDER — PALONOSETRON HCL INJECTION 0.25 MG/5ML
0.2500 mg | Freq: Once | INTRAVENOUS | Status: AC
  Administered 2024-08-02: 0.25 mg via INTRAVENOUS
  Filled 2024-08-02: qty 5

## 2024-08-02 MED ORDER — DEXAMETHASONE SOD PHOSPHATE PF 10 MG/ML IJ SOLN
10.0000 mg | Freq: Once | INTRAMUSCULAR | Status: AC
  Administered 2024-08-02: 10 mg via INTRAVENOUS

## 2024-08-02 MED ORDER — SODIUM CHLORIDE 0.9 % IV SOLN
150.0000 mg/m2 | Freq: Once | INTRAVENOUS | Status: AC
  Administered 2024-08-02: 300 mg via INTRAVENOUS
  Filled 2024-08-02: qty 15

## 2024-08-02 MED ORDER — LEUCOVORIN CALCIUM INJECTION 350 MG
400.0000 mg/m2 | Freq: Once | INTRAVENOUS | Status: AC
  Administered 2024-08-02: 748 mg via INTRAVENOUS
  Filled 2024-08-02: qty 37.4

## 2024-08-02 MED ORDER — DEXTROSE 5 % IV SOLN: INTRAVENOUS | Status: DC

## 2024-08-02 MED ORDER — OXALIPLATIN CHEMO INJECTION 100 MG/20ML
85.0000 mg/m2 | Freq: Once | INTRAVENOUS | Status: AC
  Administered 2024-08-02: 150 mg via INTRAVENOUS
  Filled 2024-08-02: qty 10

## 2024-08-02 MED ORDER — SODIUM CHLORIDE 0.9 % IV SOLN
150.0000 mg | Freq: Once | INTRAVENOUS | Status: AC
  Administered 2024-08-02: 150 mg via INTRAVENOUS
  Filled 2024-08-02: qty 150

## 2024-08-02 MED ORDER — SODIUM CHLORIDE 0.9 % IV SOLN: INTRAVENOUS | Status: DC

## 2024-08-02 MED ORDER — SODIUM CHLORIDE 0.9 % IV SOLN
5.9000 mg/kg | Freq: Once | INTRAVENOUS | Status: AC
  Administered 2024-08-02: 400 mg via INTRAVENOUS
  Filled 2024-08-02: qty 20

## 2024-08-02 NOTE — Patient Instructions (Signed)
 [facility] - A DEPT OF [facility]  Discharge Instructions: Thank you for choosing [facility] to provide your oncology and hematology care.   If you have a lab appointment with the Cancer Center, please go directly to the Cancer Center and check in at the registration area.   Wear comfortable clothing and clothing appropriate for easy access to any Portacath or PICC line.   We strive to give you quality time with your provider. You may need to reschedule your appointment if you arrive late (15 or more minutes).  Arriving late affects you and other patients whose appointments are after yours.  Also, if you miss three or more appointments without notifying the office, you may be dismissed from the clinic at the providers discretion.      For prescription refill requests, have your pharmacy contact our office and allow 72 hours for refills to be completed.    Today you received the following chemotherapy and/or immunotherapy agents: Vectibix , Irinotecan , Oxaliplatin , Leucovorin , 5FU      To help prevent nausea and vomiting after your treatment, we encourage you to take your nausea medication as directed.  BELOW ARE SYMPTOMS THAT SHOULD BE REPORTED IMMEDIATELY: *FEVER GREATER THAN 100.4 F (38 C) OR HIGHER *CHILLS OR SWEATING *NAUSEA AND VOMITING THAT IS NOT CONTROLLED WITH YOUR NAUSEA MEDICATION *UNUSUAL SHORTNESS OF BREATH *UNUSUAL BRUISING OR BLEEDING *URINARY PROBLEMS (pain or burning when urinating, or frequent urination) *BOWEL PROBLEMS (unusual diarrhea, constipation, pain near the anus) TENDERNESS IN MOUTH AND THROAT WITH OR WITHOUT PRESENCE OF ULCERS (sore throat, sores in mouth, or a toothache) UNUSUAL RASH, SWELLING OR PAIN  UNUSUAL VAGINAL DISCHARGE OR ITCHING   Items with * indicate a potential emergency and should be followed up as soon as possible or go to the Emergency Department if any problems should occur.  Please show the  CHEMOTHERAPY ALERT CARD or IMMUNOTHERAPY ALERT CARD at check-in to the Emergency Department and triage nurse.  Should you have questions after your visit or need to cancel or reschedule your appointment, please contact [facility] - A DEPT OF [facility]  Dept: [phone]  and follow the prompts.  Office hours are 8:00 a.m. to 4:30 p.m. Monday - Friday. Please note that voicemails left after 4:00 p.m. may not be returned until the following business day.  We are closed weekends and major holidays. You have access to a nurse at all times for urgent questions. Please call the main number to the clinic Dept: [phone] and follow the prompts.   For any non-urgent questions, you may also contact your provider using MyChart. We now offer e-Visits for anyone 5 and older to request care online for non-urgent symptoms. For details visit [email or web address].   Also download the MyChart app! Go to the app store, search MyChart, open the app, select [place], and log in with your MyChart username and password.

## 2024-08-02 NOTE — Progress Notes (Signed)
 Patient consented to coming at 1100, prior to her 5FU pump completing, on Friday, Dec 26, in order to receive IV magnesium .  Patient consented to receiving IV Mg via PIV on Friday while 5FU finishes infusing through [facility].

## 2024-08-04 ENCOUNTER — Inpatient Hospital Stay

## 2024-08-04 VITALS — BP 115/81 | HR 89 | Temp 98.7°F | Resp 18

## 2024-08-04 DIAGNOSIS — Z5111 Encounter for antineoplastic chemotherapy: Secondary | ICD-10-CM | POA: Diagnosis not present

## 2024-08-04 DIAGNOSIS — C187 Malignant neoplasm of sigmoid colon: Secondary | ICD-10-CM

## 2024-08-04 MED ORDER — PEGFILGRASTIM INJECTION 6 MG/0.6ML ~~LOC~~
6.0000 mg | PREFILLED_SYRINGE | Freq: Once | SUBCUTANEOUS | Status: AC
  Administered 2024-08-04: 6 mg via SUBCUTANEOUS
  Filled 2024-08-04: qty 0.6

## 2024-08-04 MED ORDER — MAGNESIUM SULFATE 2 GM/50ML IV SOLN
2.0000 g | Freq: Once | INTRAVENOUS | Status: AC
  Administered 2024-08-04: 2 g via INTRAVENOUS
  Filled 2024-08-04: qty 50

## 2024-08-16 MED FILL — Fosaprepitant Dimeglumine For IV Infusion 150 MG (Base Eq): INTRAVENOUS | Qty: 5 | Status: AC

## 2024-08-16 NOTE — Assessment & Plan Note (Addendum)
 Probable stage IV with liver metastasis, MMR normal, TMB 3.2, KRAS/NRAS/BRAF wild type, TP53 and APC mutation (+) -Diagnosed in October 2025.  Patient presented with constipation for 4 to 5 months. -Colonoscopy on June 05, 2024 showed a fungating and infiltrative partially obstructing large mass in the sigmoid colon, measuring 10 cm, involving one half of the lumen.  Biopsy confirmed poorly differentiated adenocarcinoma.  MMR normal. -Liver MRI showed 1.9 cm lesion in the duodenum, and 2 additional 1.0 to 1.5 cm mass in the left lobe of liver, concerning for metastasis. -liver biopsy was attempted but not feasible due to location in the dome  -She started chemo FOLFOXFIRI on 07/03/2024 with GCSF, Vectibix  added from cycle 2  - 08/17/2024 -she presents for cycle 4 day 1 chemotherapy with FOLFOXIRI and Vectibix .  IV magnesium  2 g added to chemotherapy.  G-CSF injection to be given on day 3 of treatment.  Continue with chemotherapy as scheduled.

## 2024-08-16 NOTE — Progress Notes (Signed)
 " Patient Care Team: Dwight Trula SQUIBB, MD as PCP - General (Internal Medicine)  Clinic Day:  08/17/2024  Referring physician: Dwight Trula SQUIBB, MD  ASSESSMENT & PLAN:   Assessment & Plan: Cancer of sigmoid colon (HCC) Probable stage IV with liver metastasis, MMR normal, TMB 3.2, KRAS/NRAS/BRAF wild type, TP53 and APC mutation (+) -Diagnosed in October 2025.  Patient presented with constipation for 4 to 5 months. -Colonoscopy on June 05, 2024 showed a fungating and infiltrative partially obstructing large mass in the sigmoid colon, measuring 10 cm, involving one half of the lumen.  Biopsy confirmed poorly differentiated adenocarcinoma.  MMR normal. -Liver MRI showed 1.9 cm lesion in the duodenum, and 2 additional 1.0 to 1.5 cm mass in the left lobe of liver, concerning for metastasis. -liver biopsy was attempted but not feasible due to location in the dome  -She started chemo FOLFOXFIRI on 07/03/2024 with GCSF, Vectibix  added from cycle 2  - 08/17/2024 -she presents for cycle 4 day 1 chemotherapy with FOLFOXIRI and Vectibix .  IV magnesium  2 g added to chemotherapy.  G-CSF injection to be given on day 3 of treatment.  Continue with chemotherapy as scheduled.   Diarrhea Likely due to chemotherapy FOLFOXIRI.  Likely cause of hypokalemia.  She is discussed addition of Imodium  as needed.  If ineffective, will send prescription for Lomotil.  Encouraged aggressive oral hydration along with electrolyte replacement such as liquid IV, Gatorade, or Pedialyte.  Dental problems Patient does have dentures plus a partial which are not fitting well since weight loss prior to her diagnosis of colon cancer.  She would like to see her dentist for adjustment of partial device.  This is not invasive procedure and should not resulted in any bleeding and minimal risk of infection.  Advised this would be okay, but invasive procedures should be avoided while on chemotherapy.  Cold sensitivity Patient patient reports  significant growth sensitivity during and directly after chemotherapy treatment.  Slowly improves after treatment.  Protein drinks are difficult to tolerate due to the cold sensitivity.  She states they are unpalatable warm.  We discussed adding protein powder to warm and more tolerable foods as this is often more tolerable and will help keep increased protein and caloric intake and prevent further weight loss.  Plan Labs reviewed. -Mild and stable anemia. - Hypokalemia.  Will send prescription for oral potassium twice daily.  Recheck in 2 weeks with subsequent treatment. - Magnesium  1.3.  She has received 2 g IV magnesium  with with chemotherapy. Labs and patient presentation are appropriate for treatment today. Proceed with cycle 4 chemotherapy FOLFOXIRI and Vectibix .  Additional magnesium  to be given IV during treatment. , Follow-up, and subsequent treatments as scheduled.    The patient understands the plans discussed today and is in agreement with them.  She knows to contact our office if she develops concerns prior to her next appointment.  I provided 25 minutes of face-to-face time during this encounter and > 50% was spent counseling as documented under my assessment and plan.    Powell FORBES Lessen, NP  [facility] - A DEPT OF [facility] [address] [place] [address] Dept: [phone] Dept Fax: [phone]   No orders of the defined types were placed in this encounter.     CHIEF COMPLAINT:  CC: Cancer of sigmoid colon  Current Treatment: First-line chemotherapy FOLFOXIRI and Vectibix  every 2 weeks  INTERVAL HISTORY:  Edsel  is here today for repeat clinical assessment.  She was last seen by Dr. Lanny on 08/01/2024.  Today, she presents for cycle 4 day 1 of first-line chemotherapy FOLFOXIRI with Vectibix .  She has developed pruritic and painful rash on her chest and nose.  She was started on oral  doxycycline  and topical clindamycin .  She does have cold sensitivity, especially during and directly after chemotherapy treatment.  She reports nausea.  Taking antiemetic which is effective.  She also has lots of diarrhea, likely the cause of her hypokalemia.  Recommended use of OTC Imodium .  If this does not help, will send in prescription for Lomotil.  She denies chest pain, chest pressure, or shortness of breath. She denies headaches or visual disturbances.  She denies fevers or chills. She denies pain. Her appetite is good. Her weight has been stable.  I have reviewed the past medical history, past surgical history, social history and family history with the patient and they are unchanged from previous note.  ALLERGIES:  is allergic to ibuprofen.  MEDICATIONS:  Current Outpatient Medications  Medication Sig Dispense Refill   atorvastatin (LIPITOR) 20 MG tablet      clindamycin  (CLEOCIN  T) 1 % external solution Apply topically 2 (two) times daily. 60 mL 1   clobetasol ointment (TEMOVATE) 0.05 % APPLY OINTMENT TOPICALLY TWICE DAILY FOR UP TO 2 WEEKS CONSECUTIVE THEN SWITCH TO TACROLIMUS FOR A WEEK, THEN ROTATE BACK (Patient taking differently: Takes as needed.)     desonide (DESOWEN) 0.05 % ointment APPLY OINTMENT TOPICALLY TWICE DAILY (Patient taking differently: Takes as needed.)     dexamethasone  (DECADRON ) 4 MG tablet Take 2 tablets (8 mg total) by mouth 2 (two) times daily with a meal. Take 2 tablets (8mg ) daily x3 days starting the day after chemotherapy.  Take with food. 30 tablet 1   doxycycline  (VIBRA -TABS) 100 MG tablet Take 1 tablet (100 mg total) by mouth 2 (two) times daily. 60 tablet 2   fluconazole  (DIFLUCAN ) 100 MG tablet Take 1 tablet (100 mg total) by mouth daily. Take once as needed for vaginal yeast infection from oral antibiotics. 3 tablet 0   hydroquinone 4 % cream APPLY CREAM TOPICALLY TO AFFECTED AREA ONCE DAILY FOR UP TO 3 MONTHS (Patient taking differently: Takes as  needed.)     insulin  degludec (TRESIBA  FLEXTOUCH) 200 UNIT/ML FlexTouch Pen Inject 36 Units into the skin daily. 9 mL 3   ketoconazole (NIZORAL) 2 % cream APPLY CREAM TOPICALLY TWICE DAILY (Patient taking differently: Takes as needed.)     loperamide  (IMODIUM ) 2 MG capsule Take 2 capsules (4 mg total) by mouth as needed for diarrhea or loose stools. , then 1 tab with each additional loose stool as needed.  Do not exceed 8 tabs in 24hr period. 60 capsule 1   medroxyPROGESTERone  (DEPO-PROVERA ) 150 MG/ML injection Inject 150 mg into the muscle every 3 (three) months. Reported on 11/06/2015     ondansetron  (ZOFRAN ) 8 MG tablet Take 1 tablet (8 mg total) by mouth every 8 (eight) hours as needed for nausea or vomiting. Start on the 3rd day after chemotherapy 20 tablet 1   polyethylene glycol powder (GLYCOLAX/MIRALAX) 17 GM/SCOOP powder Take 17 g by mouth. (Patient taking differently: Take 17 g by mouth. Takes as needed.)     potassium chloride  SA (KLOR-CON  M) 20 MEQ tablet Take 1 tablet (20 mEq total) by mouth daily. 30 tablet 1   prochlorperazine  (COMPAZINE ) 10 MG tablet Take 1 tablet (10 mg total) by mouth  every 6 (six) hours as needed for nausea or vomiting. 30 tablet 0   Semaglutide , 1 MG/DOSE, (OZEMPIC , 1 MG/DOSE,) 4 MG/3ML SOPN Inject 1 mg into the skin once a week. 9 mL 2   valACYclovir  (VALTREX ) 500 MG tablet Take 500 mg by mouth 2 (two) times daily. For 7 days as needed     No current facility-administered medications for this visit.    HISTORY OF PRESENT ILLNESS:   Oncology History  Cancer of sigmoid colon (HCC)  06/05/2024 Cancer Staging   Staging form: Colon and Rectum, AJCC 8th Edition - Clinical stage from 06/05/2024: Stage Unknown (cTX, cN0, cM1) - Signed by Lanny Callander, MD on 06/21/2024 Stage prefix: Initial diagnosis Total positive nodes: 0 Histologic grade (G): G2 Histologic grading system: 4 grade system   06/06/2024 Initial Diagnosis   Cancer of sigmoid colon (HCC)    06/20/2024 Genetic Testing   Negative genetic testing. Report date is 06/20/2024.  The CancerNext-Expanded gene panel offered by [facility] and includes sequencing, rearrangement, and RNA analysis for the following 77 genes: AIP, ALK, APC, ATM, BAP1, BARD1, BMPR1A, BRCA1, BRCA2, BRIP1, CDC73, CDH1, CDK4, CDKN1B, CDKN2A, CEBPA, CHEK2, CTNNA1, DDX41, DICER1, ETV6, FH, FLCN, GATA2, LZTR1, MAX, MBD4, MEN1, MET, MLH1, MSH2, MSH3, MSH6, MUTYH, NF1, NF2, NTHL1, PALB2, PHOX2B, PMS2, POT1, PRKAR1A, PTCH1, PTEN, RAD51C, RAD51D, RB1, RET, RPS20, RUNX1, SDHA, SDHAF2, SDHB, SDHC, SDHD, SMAD4, SMARCA4, SMARCB1, SMARCE1, STK11, SUFU, TMEM127, TP53, TSC1, TSC2, VHL, and WT1 (sequencing and deletion/duplication); AXIN2, CTNNA1, DDX41, EGFR, HOXB13, KIT, MBD4, MITF, MSH3, PDGFRA, POLD1 and POLE (sequencing only); EPCAM and GREM1 (deletion/duplication only). RNA data is routinely analyzed for use in variant interpretation for all genes.    07/03/2024 - 07/03/2024 Chemotherapy   Patient is on Treatment Plan : COLORECTAL FOLFIRINOX + Bevacizumab q14d     07/03/2024 -  Chemotherapy   Patient is on Treatment Plan : COLORECTAL Vectibix  + FOLFOXIRI Q14D         REVIEW OF SYSTEMS:   Constitutional: Denies fevers, chills or abnormal weight loss Eyes: Denies blurriness of vision Ears, nose, mouth, throat, and face: Denies mucositis or sore throat.  Having some difficulty with eating since weight loss.  Dentures and partial not fitting appropriately making it difficult to chew food. Respiratory: Denies cough, dyspnea or wheezes Cardiovascular: Denies palpitation, chest discomfort or lower extremity swelling Gastrointestinal: Having nausea and a great deal diarrhea. Skin: Denies abnormal skin rashes Lymphatics: Denies new lymphadenopathy or easy bruising Neurological:Denies numbness, tingling or new weaknesses.  Chemotherapy and directly afterwards. Behavioral/Psych: Mood is stable, no new changes  All other  systems were reviewed with the patient and are negative.   Today's Vitals   08/17/24 0854  BP: 114/74  Pulse: 92  Resp: 17  Temp: 98 F (36.7 C)  SpO2: 100%  Weight: 158 lb 6.4 oz (71.8 kg)  PainSc: 0-No pain   Body mass index is 24.81 kg/m.    Wt Readings from Last 3 Encounters:  08/31/24 163 lb 11.2 oz (74.3 kg)  08/17/24 158 lb 6.4 oz (71.8 kg)  08/01/24 158 lb (71.7 kg)    Body mass index is 24.81 kg/m.  Performance status (ECOG): 2 - Symptomatic, <50% confined to bed  PHYSICAL EXAM:   GENERAL:alert, no distress and comfortable SKIN: skin color, texture, turgor are normal, no rashes or significant lesions EYES: normal, Conjunctiva are pink and non-injected, sclera clear OROPHARYNX:no exudate, no erythema and lips, buccal mucosa, and tongue normal  NECK: supple, thyroid  normal size, non-tender, without  nodularity LYMPH:  no palpable lymphadenopathy in the cervical, axillary or inguinal LUNGS: clear to auscultation and percussion with normal breathing effort HEART: regular rate & rhythm and no murmurs and no lower extremity edema ABDOMEN:abdomen soft, non-tender and normal bowel sounds Musculoskeletal:no cyanosis of digits and no clubbing  NEURO: alert & oriented x 3 with fluent speech, no focal motor/sensory deficits  LABORATORY DATA:  I have reviewed the data as listed    Component Value Date/Time   NA 141 08/31/2024 0925   NA 139 06/22/2019 1539   K 3.4 (L) 08/31/2024 0925   CL 107 08/31/2024 0925   CO2 23 08/31/2024 0925   GLUCOSE 292 (H) 08/31/2024 0925   BUN <5 (L) 08/31/2024 0925   BUN 11 06/22/2019 1539   CREATININE 0.48 08/31/2024 0925   CALCIUM  9.3 08/31/2024 0925   PROT 6.0 (L) 08/31/2024 0925   PROT 6.9 06/22/2019 1539   ALBUMIN 3.7 08/31/2024 0925   ALBUMIN 4.6 06/22/2019 1539   AST 29 08/31/2024 0925   ALT 49 (H) 08/31/2024 0925   ALKPHOS 210 (H) 08/31/2024 0925   BILITOT 0.2 08/31/2024 0925   GFRNONAA >60 08/31/2024 0925   GFRAA 96  06/22/2019 1539   Lab Results  Component Value Date   WBC 5.0 08/31/2024   NEUTROABS 3.4 08/31/2024   HGB 10.3 (L) 08/31/2024   HCT 30.7 (L) 08/31/2024   MCV 92.2 08/31/2024   PLT 162 08/31/2024    "

## 2024-08-17 ENCOUNTER — Inpatient Hospital Stay: Attending: Hematology

## 2024-08-17 ENCOUNTER — Inpatient Hospital Stay (HOSPITAL_BASED_OUTPATIENT_CLINIC_OR_DEPARTMENT_OTHER): Admitting: Nurse Practitioner

## 2024-08-17 ENCOUNTER — Inpatient Hospital Stay: Admitting: Dietician

## 2024-08-17 VITALS — BP 114/74 | HR 92 | Temp 98.0°F | Resp 17 | Wt 158.4 lb

## 2024-08-17 DIAGNOSIS — Z79899 Other long term (current) drug therapy: Secondary | ICD-10-CM | POA: Insufficient documentation

## 2024-08-17 DIAGNOSIS — Z79630 Long term (current) use of alkylating agent: Secondary | ICD-10-CM | POA: Diagnosis not present

## 2024-08-17 DIAGNOSIS — E1165 Type 2 diabetes mellitus with hyperglycemia: Secondary | ICD-10-CM | POA: Diagnosis not present

## 2024-08-17 DIAGNOSIS — Z79631 Long term (current) use of antimetabolite agent: Secondary | ICD-10-CM | POA: Insufficient documentation

## 2024-08-17 DIAGNOSIS — R160 Hepatomegaly, not elsewhere classified: Secondary | ICD-10-CM | POA: Insufficient documentation

## 2024-08-17 DIAGNOSIS — R21 Rash and other nonspecific skin eruption: Secondary | ICD-10-CM | POA: Insufficient documentation

## 2024-08-17 DIAGNOSIS — T380X5A Adverse effect of glucocorticoids and synthetic analogues, initial encounter: Secondary | ICD-10-CM | POA: Diagnosis not present

## 2024-08-17 DIAGNOSIS — K0889 Other specified disorders of teeth and supporting structures: Secondary | ICD-10-CM | POA: Insufficient documentation

## 2024-08-17 DIAGNOSIS — Z793 Long term (current) use of hormonal contraceptives: Secondary | ICD-10-CM | POA: Diagnosis not present

## 2024-08-17 DIAGNOSIS — C787 Secondary malignant neoplasm of liver and intrahepatic bile duct: Secondary | ICD-10-CM | POA: Diagnosis not present

## 2024-08-17 DIAGNOSIS — Z808 Family history of malignant neoplasm of other organs or systems: Secondary | ICD-10-CM | POA: Insufficient documentation

## 2024-08-17 DIAGNOSIS — C187 Malignant neoplasm of sigmoid colon: Secondary | ICD-10-CM

## 2024-08-17 DIAGNOSIS — Z5111 Encounter for antineoplastic chemotherapy: Secondary | ICD-10-CM | POA: Diagnosis present

## 2024-08-17 DIAGNOSIS — K5900 Constipation, unspecified: Secondary | ICD-10-CM | POA: Diagnosis not present

## 2024-08-17 DIAGNOSIS — T451X5A Adverse effect of antineoplastic and immunosuppressive drugs, initial encounter: Secondary | ICD-10-CM | POA: Diagnosis not present

## 2024-08-17 DIAGNOSIS — L299 Pruritus, unspecified: Secondary | ICD-10-CM | POA: Diagnosis not present

## 2024-08-17 DIAGNOSIS — Z9089 Acquired absence of other organs: Secondary | ICD-10-CM | POA: Insufficient documentation

## 2024-08-17 DIAGNOSIS — K1379 Other lesions of oral mucosa: Secondary | ICD-10-CM | POA: Insufficient documentation

## 2024-08-17 DIAGNOSIS — Z79634 Long term (current) use of topoisomerase inhibitor: Secondary | ICD-10-CM | POA: Insufficient documentation

## 2024-08-17 DIAGNOSIS — K769 Liver disease, unspecified: Secondary | ICD-10-CM | POA: Insufficient documentation

## 2024-08-17 DIAGNOSIS — R197 Diarrhea, unspecified: Secondary | ICD-10-CM | POA: Insufficient documentation

## 2024-08-17 DIAGNOSIS — D6481 Anemia due to antineoplastic chemotherapy: Secondary | ICD-10-CM | POA: Diagnosis not present

## 2024-08-17 DIAGNOSIS — Z5189 Encounter for other specified aftercare: Secondary | ICD-10-CM | POA: Insufficient documentation

## 2024-08-17 DIAGNOSIS — Z79624 Long term (current) use of inhibitors of nucleotide synthesis: Secondary | ICD-10-CM | POA: Insufficient documentation

## 2024-08-17 DIAGNOSIS — Z886 Allergy status to analgesic agent status: Secondary | ICD-10-CM | POA: Insufficient documentation

## 2024-08-17 DIAGNOSIS — K1230 Oral mucositis (ulcerative), unspecified: Secondary | ICD-10-CM | POA: Diagnosis not present

## 2024-08-17 LAB — CMP (CANCER CENTER ONLY)
ALT: 29 U/L (ref 0–44)
AST: 20 U/L (ref 15–41)
Albumin: 3.7 g/dL (ref 3.5–5.0)
Alkaline Phosphatase: 174 U/L — ABNORMAL HIGH (ref 38–126)
Anion gap: 10 (ref 5–15)
BUN: <5 mg/dL — ABNORMAL LOW (ref 6–20)
CO2: 25 mmol/L (ref 22–32)
Calcium: 9.4 mg/dL (ref 8.9–10.3)
Chloride: 108 mmol/L (ref 98–111)
Creatinine: 0.56 mg/dL (ref 0.44–1.00)
GFR, Estimated: >60 mL/min
Glucose, Bld: 212 mg/dL — ABNORMAL HIGH (ref 70–99)
Potassium: 2.8 mmol/L — ABNORMAL LOW (ref 3.5–5.1)
Sodium: 143 mmol/L (ref 135–145)
Total Bilirubin: <0.2 mg/dL (ref 0.0–1.2)
Total Protein: 5.8 g/dL — ABNORMAL LOW (ref 6.5–8.1)

## 2024-08-17 LAB — CBC WITH DIFFERENTIAL (CANCER CENTER ONLY)
Abs Immature Granulocytes: 0.05 K/uL (ref 0.00–0.07)
Basophils Absolute: 0 K/uL (ref 0.0–0.1)
Basophils Relative: 1 %
Eosinophils Absolute: 0.1 K/uL (ref 0.0–0.5)
Eosinophils Relative: 2 %
HCT: 30.7 % — ABNORMAL LOW (ref 36.0–46.0)
Hemoglobin: 10.5 g/dL — ABNORMAL LOW (ref 12.0–15.0)
Immature Granulocytes: 1 %
Lymphocytes Relative: 34 %
Lymphs Abs: 1.3 K/uL (ref 0.7–4.0)
MCH: 30.7 pg (ref 26.0–34.0)
MCHC: 34.2 g/dL (ref 30.0–36.0)
MCV: 89.8 fL (ref 80.0–100.0)
Monocytes Absolute: 0.3 K/uL (ref 0.1–1.0)
Monocytes Relative: 6 %
Neutro Abs: 2.1 K/uL (ref 1.7–7.7)
Neutrophils Relative %: 56 %
Platelet Count: 182 K/uL (ref 150–400)
RBC: 3.42 MIL/uL — ABNORMAL LOW (ref 3.87–5.11)
RDW: 15.2 % (ref 11.5–15.5)
WBC Count: 3.9 K/uL — ABNORMAL LOW (ref 4.0–10.5)
nRBC: 0 % (ref 0.0–0.2)

## 2024-08-17 LAB — MAGNESIUM: Magnesium: 1.3 mg/dL — ABNORMAL LOW (ref 1.7–2.4)

## 2024-08-17 MED ORDER — DEXAMETHASONE SOD PHOSPHATE PF 10 MG/ML IJ SOLN
10.0000 mg | Freq: Once | INTRAMUSCULAR | Status: AC
  Administered 2024-08-17: 10 mg via INTRAVENOUS

## 2024-08-17 MED ORDER — DEXTROSE 5 % IV SOLN: INTRAVENOUS | Status: DC

## 2024-08-17 MED ORDER — ATROPINE SULFATE 1 MG/ML IV SOLN
0.5000 mg | Freq: Once | INTRAVENOUS | Status: AC | PRN
  Administered 2024-08-17: 0.5 mg via INTRAVENOUS
  Filled 2024-08-17: qty 1

## 2024-08-17 MED ORDER — SODIUM CHLORIDE 0.9 % IV SOLN
150.0000 mg | Freq: Once | INTRAVENOUS | Status: AC
  Administered 2024-08-17: 150 mg via INTRAVENOUS
  Filled 2024-08-17: qty 150

## 2024-08-17 MED ORDER — POTASSIUM CHLORIDE CRYS ER 20 MEQ PO TBCR: 20.0000 meq | EXTENDED_RELEASE_TABLET | Freq: Every day | ORAL | 1 refills | Status: AC

## 2024-08-17 MED ORDER — SODIUM CHLORIDE 0.9 % IV SOLN: INTRAVENOUS | Status: DC

## 2024-08-17 MED ORDER — SODIUM CHLORIDE 0.9 % IV SOLN
5.9000 mg/kg | Freq: Once | INTRAVENOUS | Status: AC
  Administered 2024-08-17: 400 mg via INTRAVENOUS
  Filled 2024-08-17: qty 20

## 2024-08-17 MED ORDER — MAGNESIUM SULFATE 2 GM/50ML IV SOLN
2.0000 g | Freq: Once | INTRAVENOUS | Status: AC
  Administered 2024-08-17: 2 g via INTRAVENOUS
  Filled 2024-08-17: qty 50

## 2024-08-17 MED ORDER — POTASSIUM CHLORIDE 10 MEQ/100ML IV SOLN
10.0000 meq | Freq: Once | INTRAVENOUS | Status: AC
  Administered 2024-08-17: 10 meq via INTRAVENOUS
  Filled 2024-08-17: qty 100

## 2024-08-17 MED ORDER — PALONOSETRON HCL INJECTION 0.25 MG/5ML
0.2500 mg | Freq: Once | INTRAVENOUS | Status: AC
  Administered 2024-08-17: 0.25 mg via INTRAVENOUS
  Filled 2024-08-17: qty 5

## 2024-08-17 MED ORDER — OXALIPLATIN CHEMO INJECTION 100 MG/20ML
85.0000 mg/m2 | Freq: Once | INTRAVENOUS | Status: AC
  Administered 2024-08-17: 150 mg via INTRAVENOUS
  Filled 2024-08-17: qty 3.47

## 2024-08-17 MED ORDER — SODIUM CHLORIDE 0.9 % IV SOLN
2400.0000 mg/m2 | INTRAVENOUS | Status: DC
  Administered 2024-08-17: 4500 mg via INTRAVENOUS
  Filled 2024-08-17: qty 90

## 2024-08-17 MED ORDER — SODIUM CHLORIDE 0.9 % IV SOLN
150.0000 mg/m2 | Freq: Once | INTRAVENOUS | Status: AC
  Administered 2024-08-17: 300 mg via INTRAVENOUS
  Filled 2024-08-17: qty 15

## 2024-08-17 MED ORDER — LEUCOVORIN CALCIUM INJECTION 350 MG
400.0000 mg/m2 | Freq: Once | INTRAVENOUS | Status: AC
  Administered 2024-08-17: 748 mg via INTRAVENOUS
  Filled 2024-08-17: qty 25

## 2024-08-17 NOTE — Progress Notes (Signed)
 Nutrition Follow-up:  Patient with metastatic colon cancer to liver and possible tumor involvement of uterus and ovary. Gynecology/surgery following. Patient started [facility] 11/24. Vectibix  added cycle 2. Patient under the care of Dr. Lanny.   Met with patient in infusion. She reports diarrhea lasting 2-3 days after pump disconnect. Says she might as well stay in the bathroom those days. She is taking lomotil and imodium . Her appetite remains good. Says food is her love language. Having occasional taste changes and is turned off by foods she normally likes. Patient has stopped Ozempic  and noticed improved appetite. She is concerned about rise in glucose. Patient also gives 36 units tresiba  daily.    Medications: reviewed   Labs: Mg 1.3, K 2.8 (receiving IV repletion today)  Anthropometrics: Wt 158 lb 6.4 oz today   12/23 - 158 lb  11/24 - 162 lb 6.4 oz    NUTRITION DIAGNOSIS: Food and nutrition related knowledge deficit improving    INTERVENTION:  Continue small frequent meals/snacks. Recommend protein foods at every meal  Pt will continue to monitor blood sugars  Recommend including fluids that offer electrolytes when having diarrhea - samples of liquid IV provided Educated on foods best tolerated with diarrhea - samples of banatrol provided  Antidiarrheals per MD   MONITORING, EVALUATION, GOAL: wt trends, intake   NEXT VISIT: Thursday February 5 during infusion

## 2024-08-17 NOTE — Progress Notes (Signed)
 Okay to speed up 5-FU infusion time to over 46 hours due to Saturday scheduling for pump d/c appt per Powell, NP.  Harlene Nasuti, PharmD Oncology Infusion Pharmacist 08/17/2024 10:08 AM

## 2024-08-17 NOTE — Patient Instructions (Signed)
 [facility] - A DEPT OF [place] [facility]  Discharge Instructions: Thank you for choosing [facility] to provide your oncology and hematology care.   If you have a lab appointment with the Cancer Center, please go directly to the Cancer Center and check in at the registration area.   Wear comfortable clothing and clothing appropriate for easy access to any Portacath or PICC line.   We strive to give you quality time with your provider. You may need to reschedule your appointment if you arrive late (15 or more minutes).  Arriving late affects you and other patients whose appointments are after yours.  Also, if you miss three or more appointments without notifying the office, you may be dismissed from the clinic at the providers discretion.      For prescription refill requests, have your pharmacy contact our office and allow 72 hours for refills to be completed.    Today you received the following chemotherapy and/or immunotherapy agents: Irinotecan  (Camptosar ), Leucovorin , Oxaliplatin  (Eloxatin ), Panitumumab  (Vectibix ), & Fluorouracil  (Adrucil )   To help prevent nausea and vomiting after your treatment, we encourage you to take your nausea medication as directed.  BELOW ARE SYMPTOMS THAT SHOULD BE REPORTED IMMEDIATELY: *FEVER GREATER THAN 100.4 F (38 C) OR HIGHER *CHILLS OR SWEATING *NAUSEA AND VOMITING THAT IS NOT CONTROLLED WITH YOUR NAUSEA MEDICATION *UNUSUAL SHORTNESS OF BREATH *UNUSUAL BRUISING OR BLEEDING *URINARY PROBLEMS (pain or burning when urinating, or frequent urination) *BOWEL PROBLEMS (unusual diarrhea, constipation, pain near the anus) TENDERNESS IN MOUTH AND THROAT WITH OR WITHOUT PRESENCE OF ULCERS (sore throat, sores in mouth, or a toothache) UNUSUAL RASH, SWELLING OR PAIN  UNUSUAL VAGINAL DISCHARGE OR ITCHING   Items with * indicate a potential emergency and should be followed up as soon as possible or go to the Emergency  Department if any problems should occur.  Please show the CHEMOTHERAPY ALERT CARD or IMMUNOTHERAPY ALERT CARD at check-in to the Emergency Department and triage nurse.  Should you have questions after your visit or need to cancel or reschedule your appointment, please contact [facility]  Dept: [phone]  and follow the prompts.  Office hours are 8:00 a.m. to 4:30 p.m. Monday - Friday. Please note that voicemails left after 4:00 p.m. may not be returned until the following business day.  We are closed weekends and major holidays. You have access to a nurse at all times for urgent questions. Please call the main number to the clinic Dept: [phone] and follow the prompts.   For any non-urgent questions, you may also contact your provider using MyChart. We now offer e-Visits for anyone 7 and older to request care online for non-urgent symptoms. For details visit [email or web address].   Also download the MyChart app! Go to the app store, search MyChart, open the app, select [place], and log in with your MyChart username and password.

## 2024-08-19 ENCOUNTER — Inpatient Hospital Stay

## 2024-08-19 VITALS — BP 121/75 | HR 79 | Temp 97.0°F | Resp 18

## 2024-08-19 DIAGNOSIS — C187 Malignant neoplasm of sigmoid colon: Secondary | ICD-10-CM

## 2024-08-19 DIAGNOSIS — Z5111 Encounter for antineoplastic chemotherapy: Secondary | ICD-10-CM | POA: Diagnosis not present

## 2024-08-19 MED ORDER — PEGFILGRASTIM INJECTION 6 MG/0.6ML ~~LOC~~
6.0000 mg | PREFILLED_SYRINGE | Freq: Once | SUBCUTANEOUS | Status: AC
  Administered 2024-08-19: 6 mg via SUBCUTANEOUS

## 2024-08-30 MED FILL — Fosaprepitant Dimeglumine For IV Infusion 150 MG (Base Eq): INTRAVENOUS | Qty: 5 | Status: AC

## 2024-08-30 NOTE — Assessment & Plan Note (Signed)
 Probable stage IV with liver metastasis, MMR normal, TMB 3.2, KRAS/NRAS/BRAF wild type, TP53 and APC mutation (+) -Diagnosed in October 2025.  Patient presented with constipation for 4 to 5 months. -Colonoscopy on June 05, 2024 showed a fungating and infiltrative partially obstructing large mass in the sigmoid colon, measuring 10 cm, involving one half of the lumen.  Biopsy confirmed poorly differentiated adenocarcinoma.  MMR normal. -Liver MRI showed 1.9 cm lesion in the duodenum, and 2 additional 1.0 to 1.5 cm mass in the left lobe of liver, concerning for metastasis. -liver biopsy was attempted but not feasible due to location in the dome  -She started chemo FOLFOXFIRI on 07/03/2024 with GCSF, Vectibix  added from cycle 2

## 2024-08-31 ENCOUNTER — Inpatient Hospital Stay

## 2024-08-31 ENCOUNTER — Inpatient Hospital Stay (HOSPITAL_BASED_OUTPATIENT_CLINIC_OR_DEPARTMENT_OTHER): Admitting: Hematology

## 2024-08-31 VITALS — BP 130/82 | HR 102 | Temp 98.5°F | Resp 17 | Ht 67.0 in | Wt 163.7 lb

## 2024-08-31 VITALS — HR 96

## 2024-08-31 DIAGNOSIS — Z5111 Encounter for antineoplastic chemotherapy: Secondary | ICD-10-CM | POA: Diagnosis not present

## 2024-08-31 DIAGNOSIS — C187 Malignant neoplasm of sigmoid colon: Secondary | ICD-10-CM

## 2024-08-31 DIAGNOSIS — D500 Iron deficiency anemia secondary to blood loss (chronic): Secondary | ICD-10-CM

## 2024-08-31 LAB — MAGNESIUM: Magnesium: 1.4 mg/dL — ABNORMAL LOW (ref 1.7–2.4)

## 2024-08-31 LAB — FERRITIN: Ferritin: 99 ng/mL (ref 11–307)

## 2024-08-31 LAB — CMP (CANCER CENTER ONLY)
ALT: 49 U/L — ABNORMAL HIGH (ref 0–44)
AST: 29 U/L (ref 15–41)
Albumin: 3.7 g/dL (ref 3.5–5.0)
Alkaline Phosphatase: 210 U/L — ABNORMAL HIGH (ref 38–126)
Anion gap: 11 (ref 5–15)
BUN: <5 mg/dL — ABNORMAL LOW (ref 6–20)
CO2: 23 mmol/L (ref 22–32)
Calcium: 9.3 mg/dL (ref 8.9–10.3)
Chloride: 107 mmol/L (ref 98–111)
Creatinine: 0.48 mg/dL (ref 0.44–1.00)
GFR, Estimated: >60 mL/min
Glucose, Bld: 292 mg/dL — ABNORMAL HIGH (ref 70–99)
Potassium: 3.4 mmol/L — ABNORMAL LOW (ref 3.5–5.1)
Sodium: 141 mmol/L (ref 135–145)
Total Bilirubin: 0.2 mg/dL (ref 0.0–1.2)
Total Protein: 6 g/dL — ABNORMAL LOW (ref 6.5–8.1)

## 2024-08-31 LAB — CBC WITH DIFFERENTIAL (CANCER CENTER ONLY)
Abs Immature Granulocytes: 0.13 K/uL — ABNORMAL HIGH (ref 0.00–0.07)
Basophils Absolute: 0 K/uL (ref 0.0–0.1)
Basophils Relative: 0 %
Eosinophils Absolute: 0 K/uL (ref 0.0–0.5)
Eosinophils Relative: 0 %
HCT: 30.7 % — ABNORMAL LOW (ref 36.0–46.0)
Hemoglobin: 10.3 g/dL — ABNORMAL LOW (ref 12.0–15.0)
Immature Granulocytes: 3 %
Lymphocytes Relative: 22 %
Lymphs Abs: 1.1 K/uL (ref 0.7–4.0)
MCH: 30.9 pg (ref 26.0–34.0)
MCHC: 33.6 g/dL (ref 30.0–36.0)
MCV: 92.2 fL (ref 80.0–100.0)
Monocytes Absolute: 0.3 K/uL (ref 0.1–1.0)
Monocytes Relative: 7 %
Neutro Abs: 3.4 K/uL (ref 1.7–7.7)
Neutrophils Relative %: 68 %
Platelet Count: 162 K/uL (ref 150–400)
RBC: 3.33 MIL/uL — ABNORMAL LOW (ref 3.87–5.11)
RDW: 16.1 % — ABNORMAL HIGH (ref 11.5–15.5)
WBC Count: 5 K/uL (ref 4.0–10.5)
nRBC: 0 % (ref 0.0–0.2)

## 2024-08-31 LAB — CEA (ACCESS): CEA (CHCC): 7.16 ng/mL — ABNORMAL HIGH (ref 0.00–5.00)

## 2024-08-31 MED ORDER — SODIUM CHLORIDE 0.9 % IV SOLN
150.0000 mg/m2 | Freq: Once | INTRAVENOUS | Status: AC
  Administered 2024-08-31: 300 mg via INTRAVENOUS
  Filled 2024-08-31: qty 15

## 2024-08-31 MED ORDER — DEXAMETHASONE SOD PHOSPHATE PF 10 MG/ML IJ SOLN
5.0000 mg | Freq: Once | INTRAMUSCULAR | Status: AC
  Administered 2024-08-31: 5 mg via INTRAVENOUS
  Filled 2024-08-31: qty 1

## 2024-08-31 MED ORDER — ATROPINE SULFATE 1 MG/ML IV SOLN
0.5000 mg | Freq: Once | INTRAVENOUS | Status: AC | PRN
  Administered 2024-08-31: 0.5 mg via INTRAVENOUS
  Filled 2024-08-31: qty 1

## 2024-08-31 MED ORDER — LEUCOVORIN CALCIUM INJECTION 350 MG
400.0000 mg/m2 | Freq: Once | INTRAVENOUS | Status: AC
  Administered 2024-08-31: 748 mg via INTRAVENOUS
  Filled 2024-08-31: qty 17.5

## 2024-08-31 MED ORDER — MAGNESIUM SULFATE 2 GM/50ML IV SOLN
2.0000 g | Freq: Once | INTRAVENOUS | Status: AC
  Administered 2024-08-31: 2 g via INTRAVENOUS
  Filled 2024-08-31: qty 50

## 2024-08-31 MED ORDER — OXALIPLATIN CHEMO INJECTION 100 MG/20ML
85.0000 mg/m2 | Freq: Once | INTRAVENOUS | Status: AC
  Administered 2024-08-31: 150 mg via INTRAVENOUS
  Filled 2024-08-31: qty 22.8

## 2024-08-31 MED ORDER — SODIUM CHLORIDE 0.9 % IV SOLN
5.9000 mg/kg | Freq: Once | INTRAVENOUS | Status: AC
  Administered 2024-08-31: 400 mg via INTRAVENOUS
  Filled 2024-08-31: qty 20

## 2024-08-31 MED ORDER — SODIUM CHLORIDE 0.9 % IV SOLN
150.0000 mg | Freq: Once | INTRAVENOUS | Status: AC
  Administered 2024-08-31: 150 mg via INTRAVENOUS
  Filled 2024-08-31: qty 150

## 2024-08-31 MED ORDER — SODIUM CHLORIDE 0.9 % IV SOLN: INTRAVENOUS | Status: DC

## 2024-08-31 MED ORDER — DEXTROSE 5 % IV SOLN: INTRAVENOUS | Status: DC

## 2024-08-31 MED ORDER — SODIUM CHLORIDE 0.9 % IV SOLN
2400.0000 mg/m2 | INTRAVENOUS | Status: DC
  Administered 2024-08-31: 4500 mg via INTRAVENOUS
  Filled 2024-08-31: qty 90

## 2024-08-31 MED ORDER — PALONOSETRON HCL INJECTION 0.25 MG/5ML
0.2500 mg | Freq: Once | INTRAVENOUS | Status: AC
  Administered 2024-08-31: 0.25 mg via INTRAVENOUS
  Filled 2024-08-31: qty 5

## 2024-08-31 NOTE — Patient Instructions (Signed)
 [facility] - A DEPT OF [place]  Discharge Instructions: Thank you for choosing [facility] to provide your oncology and hematology care.   If you have a lab appointment with the Cancer Center, please go directly to the Cancer Center and check in at the registration area.   Wear comfortable clothing and clothing appropriate for easy access to any Portacath or PICC line.   We strive to give you quality time with your provider. You may need to reschedule your appointment if you arrive late (15 or more minutes).  Arriving late affects you and other patients whose appointments are after yours.  Also, if you miss three or more appointments without notifying the office, you may be dismissed from the clinic at the providers discretion.      For prescription refill requests, have your pharmacy contact our office and allow 72 hours for refills to be completed.    Today you received the following chemotherapy and/or immunotherapy agents: Vectibix  + FOLFOXIRI      To help prevent nausea and vomiting after your treatment, we encourage you to take your nausea medication as directed.  BELOW ARE SYMPTOMS THAT SHOULD BE REPORTED IMMEDIATELY: *FEVER GREATER THAN 100.4 F (38 C) OR HIGHER *CHILLS OR SWEATING *NAUSEA AND VOMITING THAT IS NOT CONTROLLED WITH YOUR NAUSEA MEDICATION *UNUSUAL SHORTNESS OF BREATH *UNUSUAL BRUISING OR BLEEDING *URINARY PROBLEMS (pain or burning when urinating, or frequent urination) *BOWEL PROBLEMS (unusual diarrhea, constipation, pain near the anus) TENDERNESS IN MOUTH AND THROAT WITH OR WITHOUT PRESENCE OF ULCERS (sore throat, sores in mouth, or a toothache) UNUSUAL RASH, SWELLING OR PAIN  UNUSUAL VAGINAL DISCHARGE OR ITCHING   Items with * indicate a potential emergency and should be followed up as soon as possible or go to the Emergency Department if any problems should occur.  Please show the CHEMOTHERAPY ALERT CARD or  IMMUNOTHERAPY ALERT CARD at check-in to the Emergency Department and triage nurse.  Should you have questions after your visit or need to cancel or reschedule your appointment, please contact [facility]  Dept: [phone]  and follow the prompts.  Office hours are 8:00 a.m. to 4:30 p.m. Monday - Friday. Please note that voicemails left after 4:00 p.m. may not be returned until the following business day.  We are closed weekends and major holidays. You have access to a nurse at all times for urgent questions. Please call the main number to the clinic Dept: [phone] and follow the prompts.   For any non-urgent questions, you may also contact your provider using MyChart. We now offer e-Visits for anyone 24 and older to request care online for non-urgent symptoms. For details visit [email or web address].   Also download the MyChart app! Go to the app store, search MyChart, open the app, select [place], and log in with your MyChart username and password.

## 2024-09-02 ENCOUNTER — Other Ambulatory Visit: Payer: Self-pay

## 2024-09-02 ENCOUNTER — Inpatient Hospital Stay

## 2024-09-02 ENCOUNTER — Encounter: Payer: Self-pay | Admitting: Hematology

## 2024-09-02 VITALS — BP 126/84 | HR 94 | Temp 98.0°F | Resp 18

## 2024-09-02 DIAGNOSIS — Z5111 Encounter for antineoplastic chemotherapy: Secondary | ICD-10-CM | POA: Diagnosis not present

## 2024-09-02 DIAGNOSIS — C187 Malignant neoplasm of sigmoid colon: Secondary | ICD-10-CM

## 2024-09-02 MED ORDER — PEGFILGRASTIM INJECTION 6 MG/0.6ML ~~LOC~~
6.0000 mg | PREFILLED_SYRINGE | Freq: Once | SUBCUTANEOUS | Status: AC
  Administered 2024-09-02: 6 mg via SUBCUTANEOUS
  Filled 2024-09-02: qty 0.6

## 2024-09-02 NOTE — Progress Notes (Signed)
 " [facility]   [phone]) [phone] Fax:([phone]   Clinic Follow up Note   Patient Care Team: Dwight Trula SQUIBB, MD as PCP - General (Internal Medicine)  Date of Service:  08/31/2024  CHIEF COMPLAINT: f/u of colon cancer  CURRENT THERAPY:   first-line chemotherapy FOLFOX every and Vectibix   Oncology History   Cancer of sigmoid colon (HCC) Probable stage IV with liver metastasis, MMR normal, TMB 3.2, KRAS/NRAS/BRAF wild type, TP53 and APC mutation (+) -Diagnosed in October 2025.  Patient presented with constipation for 4 to 5 months. -Colonoscopy on June 05, 2024 showed a fungating and infiltrative partially obstructing large mass in the sigmoid colon, measuring 10 cm, involving one half of the lumen.  Biopsy confirmed poorly differentiated adenocarcinoma.  MMR normal. -Liver MRI showed 1.9 cm lesion in the duodenum, and 2 additional 1.0 to 1.5 cm mass in the left lobe of liver, concerning for metastasis. -liver biopsy was attempted but not feasible due to location in the dome  -She started chemo FOLFOXFIRI on 07/03/2024 with GCSF, Vectibix  added from cycle 2   Assessment & Plan Metastatic sigmoid colon cancer She is undergoing active chemotherapy for metastatic sigmoid colon cancer, having completed five cycles. Tumor marker decreased from 11 to 6 over four weeks, indicating treatment response. She is tolerating therapy with only mild anemia and preserved hepatic and renal function. Surgical evaluation is deferred pending further imaging and assessment of response. - Ordered repeat CT scan of abdomen and pelvis after next chemotherapy cycle to assess disease status. - Repeated tumor marker today; results pending. - Scheduled next chemotherapy cycle and one additional cycle, contingent on CT scan findings. - Plan to discuss surgical options after CT scan results. - Ordered laboratory monitoring including blood counts, hepatic, and renal  function.  Chemotherapy-induced adverse effects (anemia, rash, oral mucositis, diarrhea, constipation) She is experiencing mild anemia, facial and truncal rash, oral mucositis, and intermittent diarrhea and constipation, consistent with chemotherapy-related adverse effects. Rash is improving but flaring on her face. Oral mucositis is causing discomfort. Diarrhea occurs post-infusion and is managed with prescribed medication, balanced with Miralax to prevent constipation. Blood counts remain stable without need for transfusion. Steroid-induced hyperglycemia is present, exacerbated by discontinuation of Ozempic . - Decreased steroid dose from 10 mg to 5 mg to reduce hyperglycemia risk while maintaining prophylaxis against infusion reaction and nausea. - Refilled fluconazole  for yeast infection prophylaxis. - Prescribed medical mouthwash for oral mucositis; advised regular mouthwash and good oral hygiene if taste is intolerable. - Advised continued use of antidiarrheal medication and Miralax as needed to balance bowel movements. - Advised her to contact her primary care physician to adjust diabetes management due to steroid-induced hyperglycemia and discontinuation of Ozempic .   Plan - Patient is tolerating chemotherapy moderately well, side effect management reviewed again today - Lab reviewed, adequate for treatment, will proceed with cycle 5 chemo today with GCSF on day 3 - Restaging CT scan in the next 2 to 3 weeks - Follow-up in 2 weeks  SUMMARY OF ONCOLOGIC HISTORY: Oncology History  Cancer of sigmoid colon (HCC)  06/05/2024 Cancer Staging   Staging form: Colon and Rectum, AJCC 8th Edition - Clinical stage from 06/05/2024: Stage Unknown (cTX, cN0, cM1) - Signed by Lanny Callander, MD on 06/21/2024 Stage prefix: Initial diagnosis Total positive nodes: 0 Histologic grade (G): G2 Histologic grading system: 4 grade system   06/06/2024 Initial Diagnosis   Cancer of sigmoid colon (HCC)    06/20/2024 Genetic Testing  Negative genetic testing. Report date is 06/20/2024.  The CancerNext-Expanded gene panel offered by [facility] and includes sequencing, rearrangement, and RNA analysis for the following 77 genes: AIP, ALK, APC, ATM, BAP1, BARD1, BMPR1A, BRCA1, BRCA2, BRIP1, CDC73, CDH1, CDK4, CDKN1B, CDKN2A, CEBPA, CHEK2, CTNNA1, DDX41, DICER1, ETV6, FH, FLCN, GATA2, LZTR1, MAX, MBD4, MEN1, MET, MLH1, MSH2, MSH3, MSH6, MUTYH, NF1, NF2, NTHL1, PALB2, PHOX2B, PMS2, POT1, PRKAR1A, PTCH1, PTEN, RAD51C, RAD51D, RB1, RET, RPS20, RUNX1, SDHA, SDHAF2, SDHB, SDHC, SDHD, SMAD4, SMARCA4, SMARCB1, SMARCE1, STK11, SUFU, TMEM127, TP53, TSC1, TSC2, VHL, and WT1 (sequencing and deletion/duplication); AXIN2, CTNNA1, DDX41, EGFR, HOXB13, KIT, MBD4, MITF, MSH3, PDGFRA, POLD1 and POLE (sequencing only); EPCAM and GREM1 (deletion/duplication only). RNA data is routinely analyzed for use in variant interpretation for all genes.    07/03/2024 - 07/03/2024 Chemotherapy   Patient is on Treatment Plan : COLORECTAL FOLFIRINOX + Bevacizumab q14d     07/03/2024 -  Chemotherapy   Patient is on Treatment Plan : COLORECTAL Vectibix  + FOLFOXIRI Q14D        Discussed the use of AI scribe software for clinical note transcription with the patient, who gave verbal consent to proceed.  History of Present Illness Ruth Mckinney is a 49 year old female with metastatic sigmoid colon cancer presenting for follow-up to assess treatment response and management of chemotherapy-related adverse effects.  She has completed five chemotherapy cycles. Fatigue peaks around days 4-5 post-infusion and improves about one week before the next cycle. She has regained some weight. Tumor marker has decreased from 11 to 6 over four weeks.  She developed a rash starting on her chest and back, which recently worsened on her face but is now improving. There is no significant pruritus or pain. She continues doxycycline  and fluconazole   prophylaxis. She has intermittent oral mucositis and currently has a sore lesion under her tongue and has not yet used medical mouthwash.  She has diarrhea for several days after chemotherapy that is controlled with antidiarrheals. She alternates with Miralax to avoid constipation. Nausea is minimal. She has mild anemia.  She has type 2 diabetes on insulin . She stopped Ozempic  per nutritionist to improve appetite and has had higher blood glucose levels, likely worsened by chemotherapy steroids.  She has not had recent surgical consultations relevant to her cancer management.     All other systems were reviewed with the patient and are negative.  MEDICAL HISTORY:  Past Medical History:  Diagnosis Date   Abnormal Pap smear    Anemia    Diabetes mellitus (HCC)    Family history of thyroid  cancer    Mother at 62   Genital herpes     SURGICAL HISTORY: Past Surgical History:  Procedure Laterality Date   ADENOIDECTOMY     CESAREAN SECTION     COLPOSCOPY     IR IMAGING GUIDED PORT INSERTION  06/30/2024   LEEP     MOUTH SURGERY      I have reviewed the social history and family history with the patient and they are unchanged from previous note.  ALLERGIES:  is allergic to ibuprofen.  MEDICATIONS:  Current Outpatient Medications  Medication Sig Dispense Refill   atorvastatin (LIPITOR) 20 MG tablet      clindamycin  (CLEOCIN  T) 1 % external solution Apply topically 2 (two) times daily. 60 mL 1   clobetasol ointment (TEMOVATE) 0.05 % APPLY OINTMENT TOPICALLY TWICE DAILY FOR UP TO 2 WEEKS CONSECUTIVE THEN SWITCH TO TACROLIMUS FOR A WEEK, THEN ROTATE BACK (Patient taking differently: Takes as  needed.)     desonide (DESOWEN) 0.05 % ointment APPLY OINTMENT TOPICALLY TWICE DAILY (Patient taking differently: Takes as needed.)     dexamethasone  (DECADRON ) 4 MG tablet Take 2 tablets (8 mg total) by mouth 2 (two) times daily with a meal. Take 2 tablets (8mg ) daily x3 days starting the day  after chemotherapy.  Take with food. 30 tablet 1   doxycycline  (VIBRA -TABS) 100 MG tablet Take 1 tablet (100 mg total) by mouth 2 (two) times daily. 60 tablet 2   fluconazole  (DIFLUCAN ) 100 MG tablet Take 1 tablet (100 mg total) by mouth daily. Take once as needed for vaginal yeast infection from oral antibiotics. 3 tablet 0   hydroquinone 4 % cream APPLY CREAM TOPICALLY TO AFFECTED AREA ONCE DAILY FOR UP TO 3 MONTHS (Patient taking differently: Takes as needed.)     insulin  degludec (TRESIBA  FLEXTOUCH) 200 UNIT/ML FlexTouch Pen Inject 36 Units into the skin daily. 9 mL 3   ketoconazole (NIZORAL) 2 % cream APPLY CREAM TOPICALLY TWICE DAILY (Patient taking differently: Takes as needed.)     loperamide  (IMODIUM ) 2 MG capsule Take 2 capsules (4 mg total) by mouth as needed for diarrhea or loose stools. , then 1 tab with each additional loose stool as needed.  Do not exceed 8 tabs in 24hr period. 60 capsule 1   medroxyPROGESTERone  (DEPO-PROVERA ) 150 MG/ML injection Inject 150 mg into the muscle every 3 (three) months. Reported on 11/06/2015     ondansetron  (ZOFRAN ) 8 MG tablet Take 1 tablet (8 mg total) by mouth every 8 (eight) hours as needed for nausea or vomiting. Start on the 3rd day after chemotherapy 20 tablet 1   polyethylene glycol powder (GLYCOLAX/MIRALAX) 17 GM/SCOOP powder Take 17 g by mouth. (Patient taking differently: Take 17 g by mouth. Takes as needed.)     potassium chloride  SA (KLOR-CON  M) 20 MEQ tablet Take 1 tablet (20 mEq total) by mouth daily. 30 tablet 1   prochlorperazine  (COMPAZINE ) 10 MG tablet Take 1 tablet (10 mg total) by mouth every 6 (six) hours as needed for nausea or vomiting. 30 tablet 0   Semaglutide , 1 MG/DOSE, (OZEMPIC , 1 MG/DOSE,) 4 MG/3ML SOPN Inject 1 mg into the skin once a week. 9 mL 2   valACYclovir  (VALTREX ) 500 MG tablet Take 500 mg by mouth 2 (two) times daily. For 7 days as needed     No current facility-administered medications for this visit.    PHYSICAL  EXAMINATION: ECOG PERFORMANCE STATUS: 1 - Symptomatic but completely ambulatory  Vitals:   08/31/24 0957  BP: 130/82  Pulse: (!) 102  Resp: 17  Temp: 98.5 F (36.9 C)  SpO2: 98%   Wt Readings from Last 3 Encounters:  08/31/24 163 lb 11.2 oz (74.3 kg)  08/17/24 158 lb 6.4 oz (71.8 kg)  08/01/24 158 lb (71.7 kg)     GENERAL:alert, no distress and comfortable SKIN: skin color, texture, turgor are normal, diffuse acne-like rash on face and upper chest EYES: normal, Conjunctiva are pink and non-injected, sclera clear NECK: supple, thyroid  normal size, non-tender, without nodularity LYMPH:  no palpable lymphadenopathy in the cervical, axillary  LUNGS: clear to auscultation and percussion with normal breathing effort HEART: regular rate & rhythm and no murmurs and no lower extremity edema ABDOMEN:abdomen soft, non-tender and normal bowel sounds Musculoskeletal:no cyanosis of digits and no clubbing  NEURO: alert & oriented x 3 with fluent speech, no focal motor/sensory deficits  Physical Exam   LABORATORY DATA:  I have  reviewed the data as listed    Latest Ref Rng & Units 08/31/2024    9:25 AM 08/17/2024    8:28 AM 08/01/2024    9:22 AM  CBC  WBC 4.0 - 10.5 K/uL 5.0  3.9  5.0   Hemoglobin 12.0 - 15.0 g/dL 89.6  89.4  88.5   Hematocrit 36.0 - 46.0 % 30.7  30.7  33.1   Platelets 150 - 400 K/uL 162  182  209         Latest Ref Rng & Units 08/31/2024    9:25 AM 08/17/2024    8:28 AM 08/01/2024    9:22 AM  CMP  Glucose 70 - 99 mg/dL 707  787  817   BUN 6 - 20 mg/dL <5  <5  <5   Creatinine 0.44 - 1.00 mg/dL 9.51  9.43  9.35   Sodium 135 - 145 mmol/L 141  143  140   Potassium 3.5 - 5.1 mmol/L 3.4  2.8  3.7   Chloride 98 - 111 mmol/L 107  108  106   CO2 22 - 32 mmol/L 23  25  24    Calcium  8.9 - 10.3 mg/dL 9.3  9.4  89.4   Total Protein 6.5 - 8.1 g/dL 6.0  5.8  6.7   Total Bilirubin 0.0 - 1.2 mg/dL 0.2  <9.7  <9.7   Alkaline Phos 38 - 126 U/L 210  174  172   AST 15 - 41 U/L  29  20  14    ALT 0 - 44 U/L 49  29  15       RADIOGRAPHIC STUDIES: I have personally reviewed the radiological images as listed and agreed with the findings in the report. No results found.    Orders Placed This Encounter  Procedures   CT ABDOMEN PELVIS W CONTRAST    Standing Status:   Future    Expected Date:   09/21/2024    Expiration Date:   08/31/2025    If indicated for the ordered procedure, I authorize the administration of contrast media per Radiology protocol:   Yes    Does the patient have a contrast media/X-ray dye allergy?:   No    Preferred imaging location?:   [facility]    Release to patient:   Immediate [1]    If indicated for the ordered procedure, I authorize the administration of oral contrast media per Radiology protocol:   Yes   Magnesium     Standing Status:   Future    Expected Date:   09/28/2024    Expiration Date:   09/28/2025   CBC with Differential (Cancer Center Only)    Standing Status:   Future    Expected Date:   09/28/2024    Expiration Date:   09/28/2025   CMP (Cancer Center only)    Standing Status:   Future    Expected Date:   09/28/2024    Expiration Date:   09/28/2025   All questions were answered. The patient knows to call the clinic with any problems, questions or concerns. No barriers to learning was detected. The total time spent in the appointment was 25 minutes, including review of chart and various tests results, discussions about plan of care and coordination of care plan     Ruth Mattock, MD 08/31/2024    "

## 2024-09-03 ENCOUNTER — Encounter: Payer: Self-pay | Admitting: Nurse Practitioner

## 2024-09-03 ENCOUNTER — Encounter: Payer: Self-pay | Admitting: Hematology

## 2024-09-13 MED FILL — Fosaprepitant Dimeglumine For IV Infusion 150 MG (Base Eq): INTRAVENOUS | Qty: 5 | Status: AC

## 2024-09-13 NOTE — Assessment & Plan Note (Signed)
 Probable stage IV with liver metastasis, MMR normal, TMB 3.2, KRAS/NRAS/BRAF wild type, TP53 and APC mutation (+) -Diagnosed in October 2025.  Patient presented with constipation for 4 to 5 months. -Colonoscopy on June 05, 2024 showed a fungating and infiltrative partially obstructing large mass in the sigmoid colon, measuring 10 cm, involving one half of the lumen.  Biopsy confirmed poorly differentiated adenocarcinoma.  MMR normal. -Liver MRI showed 1.9 cm lesion in the duodenum, and 2 additional 1.0 to 1.5 cm mass in the left lobe of liver, concerning for metastasis. -liver biopsy was attempted but not feasible due to location in the dome  -She started chemo FOLFOXFIRI on 07/03/2024 with GCSF, Vectibix  added from cycle 2

## 2024-09-14 ENCOUNTER — Inpatient Hospital Stay

## 2024-09-14 ENCOUNTER — Other Ambulatory Visit (HOSPITAL_COMMUNITY): Payer: Self-pay

## 2024-09-14 ENCOUNTER — Inpatient Hospital Stay: Admitting: Hematology

## 2024-09-14 ENCOUNTER — Encounter: Payer: Self-pay | Admitting: Hematology

## 2024-09-14 ENCOUNTER — Inpatient Hospital Stay: Attending: Hematology

## 2024-09-14 ENCOUNTER — Inpatient Hospital Stay: Admitting: Dietician

## 2024-09-14 ENCOUNTER — Other Ambulatory Visit: Payer: Self-pay

## 2024-09-14 VITALS — BP 130/90 | HR 93 | Temp 97.8°F | Resp 17 | Wt 167.5 lb

## 2024-09-14 DIAGNOSIS — C187 Malignant neoplasm of sigmoid colon: Secondary | ICD-10-CM

## 2024-09-14 DIAGNOSIS — D500 Iron deficiency anemia secondary to blood loss (chronic): Secondary | ICD-10-CM

## 2024-09-14 LAB — CBC WITH DIFFERENTIAL (CANCER CENTER ONLY)
Abs Immature Granulocytes: 0.1 10*3/uL — ABNORMAL HIGH (ref 0.00–0.07)
Basophils Absolute: 0 10*3/uL (ref 0.0–0.1)
Basophils Relative: 1 %
Eosinophils Absolute: 0.1 10*3/uL (ref 0.0–0.5)
Eosinophils Relative: 1 %
HCT: 35.1 % — ABNORMAL LOW (ref 36.0–46.0)
Hemoglobin: 11.4 g/dL — ABNORMAL LOW (ref 12.0–15.0)
Immature Granulocytes: 1 %
Lymphocytes Relative: 20 %
Lymphs Abs: 1.4 10*3/uL (ref 0.7–4.0)
MCH: 30.7 pg (ref 26.0–34.0)
MCHC: 32.5 g/dL (ref 30.0–36.0)
MCV: 94.6 fL (ref 80.0–100.0)
Monocytes Absolute: 0.5 10*3/uL (ref 0.1–1.0)
Monocytes Relative: 6 %
Neutro Abs: 5.2 10*3/uL (ref 1.7–7.7)
Neutrophils Relative %: 71 %
Platelet Count: 184 10*3/uL (ref 150–400)
RBC: 3.71 MIL/uL — ABNORMAL LOW (ref 3.87–5.11)
RDW: 16 % — ABNORMAL HIGH (ref 11.5–15.5)
WBC Count: 7.3 10*3/uL (ref 4.0–10.5)
nRBC: 0 % (ref 0.0–0.2)

## 2024-09-14 LAB — CMP (CANCER CENTER ONLY)
ALT: 48 U/L — ABNORMAL HIGH (ref 0–44)
AST: 26 U/L (ref 15–41)
Albumin: 4.3 g/dL (ref 3.5–5.0)
Alkaline Phosphatase: 266 U/L — ABNORMAL HIGH (ref 38–126)
Anion gap: 12 (ref 5–15)
BUN: <5 mg/dL — ABNORMAL LOW (ref 6–20)
CO2: 23 mmol/L (ref 22–32)
Calcium: 9.7 mg/dL (ref 8.9–10.3)
Chloride: 108 mmol/L (ref 98–111)
Creatinine: 0.5 mg/dL (ref 0.44–1.00)
GFR, Estimated: >60 mL/min
Glucose, Bld: 253 mg/dL — ABNORMAL HIGH (ref 70–99)
Potassium: 3.9 mmol/L (ref 3.5–5.1)
Sodium: 143 mmol/L (ref 135–145)
Total Bilirubin: 0.2 mg/dL (ref 0.0–1.2)
Total Protein: 6.8 g/dL (ref 6.5–8.1)

## 2024-09-14 LAB — CEA (ACCESS): CEA (CHCC): 12.93 ng/mL — ABNORMAL HIGH (ref 0.00–5.00)

## 2024-09-14 LAB — MAGNESIUM: Magnesium: 1.4 mg/dL — ABNORMAL LOW (ref 1.7–2.4)

## 2024-09-14 LAB — FERRITIN: Ferritin: 80 ng/mL (ref 11–307)

## 2024-09-14 MED ORDER — SODIUM CHLORIDE 0.9 % IV SOLN
150.0000 mg | Freq: Once | INTRAVENOUS | Status: AC
  Administered 2024-09-14: 150 mg via INTRAVENOUS
  Filled 2024-09-14: qty 5

## 2024-09-14 MED ORDER — SODIUM CHLORIDE 0.9 % IV SOLN: INTRAVENOUS | Status: DC

## 2024-09-14 MED ORDER — FLUCONAZOLE 100 MG PO TABS: 100.0000 mg | ORAL_TABLET | Freq: Every day | ORAL | 0 refills | Status: AC

## 2024-09-14 MED ORDER — DEXAMETHASONE SOD PHOSPHATE PF 10 MG/ML IJ SOLN
5.0000 mg | Freq: Once | INTRAMUSCULAR | Status: AC
  Administered 2024-09-14: 5 mg via INTRAVENOUS
  Filled 2024-09-14: qty 1

## 2024-09-14 MED ORDER — ALTEPLASE 2 MG IJ SOLR
2.0000 mg | Freq: Once | INTRAMUSCULAR | Status: AC | PRN
  Administered 2024-09-14: 2 mg
  Filled 2024-09-14: qty 2

## 2024-09-14 MED ORDER — SODIUM CHLORIDE 0.9 % IV SOLN
6.0000 mg/kg | Freq: Once | INTRAVENOUS | Status: AC
  Administered 2024-09-14: 440 mg via INTRAVENOUS
  Filled 2024-09-14: qty 5

## 2024-09-14 MED ORDER — OMEPRAZOLE 40 MG PO CPDR: 40.0000 mg | DELAYED_RELEASE_CAPSULE | Freq: Every day | ORAL | 2 refills | Status: AC

## 2024-09-14 MED ORDER — OXALIPLATIN CHEMO INJECTION 100 MG/20ML
85.0000 mg/m2 | Freq: Once | INTRAVENOUS | Status: AC
  Administered 2024-09-14: 150 mg via INTRAVENOUS
  Filled 2024-09-14: qty 21.05

## 2024-09-14 MED ORDER — MAGNESIUM OXIDE -MG SUPPLEMENT 400 (240 MG) MG PO TABS: 400.0000 mg | ORAL_TABLET | Freq: Two times a day (BID) | ORAL | 3 refills | Status: AC

## 2024-09-14 MED ORDER — DEXTROSE 5 % IV SOLN: INTRAVENOUS | Status: DC

## 2024-09-14 MED ORDER — NYSTATIN 100000 UNIT/ML MT SUSP
5.0000 mL | Freq: Four times a day (QID) | OROMUCOSAL | 1 refills | Status: AC | PRN
  Filled 2024-09-14: qty 140, 7d supply, fill #0

## 2024-09-14 MED ORDER — SODIUM CHLORIDE 0.9 % IV SOLN
2400.0000 mg/m2 | INTRAVENOUS | Status: DC
  Administered 2024-09-14: 4500 mg via INTRAVENOUS
  Filled 2024-09-14: qty 90

## 2024-09-14 MED ORDER — ATROPINE SULFATE 1 MG/ML IV SOLN
0.5000 mg | Freq: Once | INTRAVENOUS | Status: AC | PRN
  Administered 2024-09-14: 0.5 mg via INTRAVENOUS
  Filled 2024-09-14: qty 1

## 2024-09-14 MED ORDER — LEUCOVORIN CALCIUM INJECTION 350 MG
400.0000 mg/m2 | Freq: Once | INTRAVENOUS | Status: AC
  Administered 2024-09-14: 748 mg via INTRAVENOUS
  Filled 2024-09-14: qty 17.5

## 2024-09-14 MED ORDER — MAGNESIUM SULFATE 2 GM/50ML IV SOLN
2.0000 g | Freq: Once | INTRAVENOUS | Status: AC
  Administered 2024-09-14: 2 g via INTRAVENOUS
  Filled 2024-09-14: qty 50

## 2024-09-14 MED ORDER — PALONOSETRON HCL INJECTION 0.25 MG/5ML
0.2500 mg | Freq: Once | INTRAVENOUS | Status: AC
  Administered 2024-09-14: 0.25 mg via INTRAVENOUS
  Filled 2024-09-14: qty 5

## 2024-09-14 MED ORDER — SODIUM CHLORIDE 0.9 % IV SOLN
150.0000 mg/m2 | Freq: Once | INTRAVENOUS | Status: AC
  Administered 2024-09-14: 300 mg via INTRAVENOUS
  Filled 2024-09-14: qty 15

## 2024-09-14 NOTE — Progress Notes (Signed)
 Nutrition Follow-up:  Patient with metastatic colon cancer to liver and possible tumor involvement of uterus and ovary. Gynecology/surgery following. Patient started [facility] 11/24. Vectibix  added cycle 2. Patient under the care of Dr. Lanny.    Met with patient and mom in infusion. Patient experiencing painful rash to chest, face, and back secondary to vectibix . Reports rash is much less painful this week. Patient reports good appetite. Eating several small meals. Patient gets up at 3:30AM and usually has had po by 4AM. She reports eating lots of salmon, pasta, croissants, baked potatoes recently. Patient including protein at every meal. She is monitoring her blood sugars. Steroids reduced per Dr. Lanny. Patient has not contacted PCP regarding DM medications. States her provider currently out on maternity leave. Reports diarrhea well managed with lomotil and imodium . Once diarrhea resolves, she begins taking miralax to prevent constipation. Patient looking forward to upcoming scan. She is hoping tumor is now resectable.   Medications: reviewed   Labs: glucose 253, Mg 1.4, BUN 5  Anthropometrics: Wt 167 lb 8 oz today - increased   1/22 - 163 lb 11.2 oz 1/8 - 158 lb 6.4 oz    NUTRITION DIAGNOSIS: Food and nutrition related knowledge deficit improve   INTERVENTION:  Continue small meals/snacks, recommend protein foods at every meal Continue monitoring blood sugars CT with contrast planned 2/12 to assess response    MONITORING, EVALUATION, GOAL: wt trends, intake   NEXT VISIT: To be determined

## 2024-09-14 NOTE — Progress Notes (Signed)
 Verbal order w/readback from Dr Lanny for home prescribed of Magic Mouthwash 1:1 equal parts of Maalox, Nystatin , Diphenhydramine, and Lidocaine  Viscous 140ml QID PRN Swish & Swallow w/1 refill to be sent to [facility].  Prescription sent.

## 2024-09-14 NOTE — Progress Notes (Signed)
 Per Dr Lanny, ok to run 5FU pump over 43 hours, which is a rate of 3.5 ml/hr.  The pump will be DC'd between 1 & 2 pm on Saturday, 09/16/24.

## 2024-09-14 NOTE — Progress Notes (Signed)
 " [facility]   [phone] [phone]   Clinic Follow up Note   Patient Care Team: Dwight Trula SQUIBB, MD as PCP - General (Internal Medicine)  Date of Service:  09/14/2024  CHIEF COMPLAINT: f/u of metastatic colon cancer  CURRENT THERAPY:  First-line chemotherapy FOLFOXIRI and Vectibix   Oncology History   Cancer of sigmoid colon (HCC) Probable stage IV with liver metastasis, MMR normal, TMB 3.2, KRAS/NRAS/BRAF wild type, TP53 and APC mutation (+) -Diagnosed in October 2025.  Patient presented with constipation for 4 to 5 months. -Colonoscopy on June 05, 2024 showed a fungating and infiltrative partially obstructing large mass in the sigmoid colon, measuring 10 cm, involving one half of the lumen.  Biopsy confirmed poorly differentiated adenocarcinoma.  MMR normal. -Liver MRI showed 1.9 cm lesion in the duodenum, and 2 additional 1.0 to 1.5 cm mass in the left lobe of liver, concerning for metastasis. -liver biopsy was attempted but not feasible due to location in the dome  -She started chemo FOLFOXFIRI on 07/03/2024 with GCSF, Vectibix  added from cycle 2   Assessment & Plan Metastatic sigmoid colon cancer with liver metastasis Recently diagnosed, poorly differentiated, stage IV adenocarcinoma of the sigmoid colon with hepatic metastases. She has completed five cycles of FOLFOXIRI with panitumumab  and GCSF support, demonstrating an encouraging initial response with decreased tumor markers. Treatment-related toxicities remain manageable. Prognosis is guarded; further management will depend on radiographic response. Surgical and local therapies will be considered based on upcoming imaging. - Continue chemotherapy for at least one additional cycle, contingent on CT scan findings. - Ordered CT scan of abdomen and pelvis following the next chemotherapy cycle to assess disease status and response. - Continue laboratory monitoring: CBC, hepatic and renal  function, and tumor markers. - Review CT scan results and discuss with liver surgeon (Dr. Debby) regarding potential surgical intervention, ablation, or radiation based on response. - Schedule follow-up in two weeks to review imaging and treatment plan. - Discuss survivorship and long-term effects planning after response and surgical options are clarified. - Consider future clinical trial eligibility based on molecular profile. - Discuss decoupling appointments if needed for scheduling flexibility.  Chemotherapy-induced adverse effects She experiences chemotherapy-induced toxicities including fatigue, intermittent neuropathy exacerbated by cold and post-infusion, facial rash with recent worsening but some improvement in soreness, persistent acid reflux unresponsive to calcium  carbonate, mild anemia, oral mucositis, intermittent diarrhea/constipation, and steroid-induced hyperglycemia. Fatigue is self-managed with activity pacing. All toxicities are being actively managed. - Prescribed omeprazole  for acid reflux; prescription sent to pharmacy. - Initiated oral magnesium  supplementation twice daily. - Continue topical clindamycin , topical corticosteroids, doxycycline , and fluconazole  for rash management. - Manage oral mucositis with medical mouthwash and oral hygiene. - Continue loperamide  and polyethylene glycol as needed for diarrhea and constipation. - Monitor for anemia, neuropathy, and other chemotherapy toxicities at next visit; no transfusion required for anemia currently. - Monitor blood glucose and adjust steroid dosing as needed to minimize hyperglycemia. - Provided anticipatory guidance regarding fatigue and activity pacing. - Advised to wear a mask when outside to protect oropharynx from cold air. - Reinforced symptom monitoring and prompt reporting of new or worsening adverse effects.  Hypomagnesia  - Secondary to Vectibix , will give IV mag 2 g, and increase to 4 g next cycle - I  called in oral magnesium  twice daily   Plan - She is tolerating moderately chemotherapy well overall, except for skin rashes.  She will increase topical hydrocortisone and clindamycin  gel to twice a  day, continue doxycycline  - Lab reviewed, adequate for treatment, will proceed to chemotherapy today and continue every 2 weeks - She is scheduled for restaging scan next week, will discuss with her surgeon after scan to see if she is a candidate for surgical resection.   SUMMARY OF ONCOLOGIC HISTORY: Oncology History  Cancer of sigmoid colon (HCC)  06/05/2024 Cancer Staging   Staging form: Colon and Rectum, AJCC 8th Edition - Clinical stage from 06/05/2024: Stage Unknown (cTX, cN0, cM1) - Signed by Lanny Callander, MD on 06/21/2024 Stage prefix: Initial diagnosis Total positive nodes: 0 Histologic grade (G): G2 Histologic grading system: 4 grade system   06/06/2024 Initial Diagnosis   Cancer of sigmoid colon (HCC)   06/20/2024 Genetic Testing   Negative genetic testing. Report date is 06/20/2024.  The CancerNext-Expanded gene panel offered by [facility] and includes sequencing, rearrangement, and RNA analysis for the following 77 genes: AIP, ALK, APC, ATM, BAP1, BARD1, BMPR1A, BRCA1, BRCA2, BRIP1, CDC73, CDH1, CDK4, CDKN1B, CDKN2A, CEBPA, CHEK2, CTNNA1, DDX41, DICER1, ETV6, FH, FLCN, GATA2, LZTR1, MAX, MBD4, MEN1, MET, MLH1, MSH2, MSH3, MSH6, MUTYH, NF1, NF2, NTHL1, PALB2, PHOX2B, PMS2, POT1, PRKAR1A, PTCH1, PTEN, RAD51C, RAD51D, RB1, RET, RPS20, RUNX1, SDHA, SDHAF2, SDHB, SDHC, SDHD, SMAD4, SMARCA4, SMARCB1, SMARCE1, STK11, SUFU, TMEM127, TP53, TSC1, TSC2, VHL, and WT1 (sequencing and deletion/duplication); AXIN2, CTNNA1, DDX41, EGFR, HOXB13, KIT, MBD4, MITF, MSH3, PDGFRA, POLD1 and POLE (sequencing only); EPCAM and GREM1 (deletion/duplication only). RNA data is routinely analyzed for use in variant interpretation for all genes.    07/03/2024 - 07/03/2024 Chemotherapy   Patient is on  Treatment Plan : COLORECTAL FOLFIRINOX + Bevacizumab q14d     07/03/2024 -  Chemotherapy   Patient is on Treatment Plan : COLORECTAL Vectibix  + FOLFOXIRI Q14D        Discussed the use of AI scribe software for clinical note transcription with the patient, who gave verbal consent to proceed.  History of Present Illness Ruth Mckinney is a 49 year old female with metastatic poorly differentiated adenocarcinoma of the sigmoid colon with liver metastases who presents for follow-up evaluation of chemotherapy-related adverse effects.  She is on systemic chemotherapy with FOLFOXIRI and Vectibix  for metastatic sigmoid colon cancer with hepatic involvement and has completed five cycles. A CT scan is scheduled next week to assess response.  She has persistent fatigue of variable severity that she manages by pacing her activity. She has had no new chemotherapy complications outside her known adverse effect profile.  Over the past week she developed worsening facial rash with earlier significant soreness around the eyes and zygomatic areas and extension to her back, with milder involvement of the chest and no new lesions. She uses topical clindamycin  gel and steroid twice daily plus oral doxycycline . Facial soreness has improved, with residual mild discomfort and pruritus. She also remains on fluconazole .  She has persistent gastroesophageal reflux with postprandial regurgitation and sensation of fluid in the throat, independent of food type. Tums has not helped. She has not started omeprazole  or Prilosec and is seeking guidance before beginning acid suppression.  She reports intermittent peripheral neuropathy with numbness and paresthesia triggered by cold exposure, most prominent immediately after infusions and then improving. Symptoms are not constant. She has mild neuropathic symptoms today because she feels cold.  She receives IV magnesium  with chemotherapy. She has not taken oral magnesium . Her  most recent magnesium  level was 1.4.  Aug 31, 2024: Follow-up for metastatic sigmoid colon cancer after five cycles of FOLFOXIRI plus Vectibix ; tumor  marker decreased from 11 to 6, indicating positive response. Mild anemia, facial/truncal rash, oral mucositis, and intermittent diarrhea/constipation managed with medications. Steroid-induced hyperglycemia noted; steroid dose reduced and diabetes management adjusted. Surgical evaluation deferred pending upcoming CT scan to assess disease status.     All other systems were reviewed with the patient and are negative.  MEDICAL HISTORY:  Past Medical History:  Diagnosis Date   Abnormal Pap smear    Anemia    Diabetes mellitus (HCC)    Family history of thyroid  cancer    Mother at 36   Genital herpes     SURGICAL HISTORY: Past Surgical History:  Procedure Laterality Date   ADENOIDECTOMY     CESAREAN SECTION     COLPOSCOPY     IR IMAGING GUIDED PORT INSERTION  06/30/2024   LEEP     MOUTH SURGERY      I have reviewed the social history and family history with the patient and they are unchanged from previous note.  ALLERGIES:  is allergic to ibuprofen.  MEDICATIONS:  Current Outpatient Medications  Medication Sig Dispense Refill   magnesium  oxide (MAG-OX) 400 (240 Mg) MG tablet Take 1 tablet (400 mg total) by mouth 2 (two) times daily. 60 tablet 3   omeprazole  (PRILOSEC) 40 MG capsule Take 1 capsule (40 mg total) by mouth daily. 30 capsule 2   atorvastatin (LIPITOR) 20 MG tablet      clindamycin  (CLEOCIN  T) 1 % external solution Apply topically 2 (two) times daily. 60 mL 1   clobetasol ointment (TEMOVATE) 0.05 % APPLY OINTMENT TOPICALLY TWICE DAILY FOR UP TO 2 WEEKS CONSECUTIVE THEN SWITCH TO TACROLIMUS FOR A WEEK, THEN ROTATE BACK (Patient taking differently: Takes as needed.)     desonide (DESOWEN) 0.05 % ointment APPLY OINTMENT TOPICALLY TWICE DAILY (Patient taking differently: Takes as needed.)     dexamethasone  (DECADRON ) 4 MG  tablet Take 2 tablets (8 mg total) by mouth 2 (two) times daily with a meal. Take 2 tablets (8mg ) daily x3 days starting the day after chemotherapy.  Take with food. 30 tablet 1   doxycycline  (VIBRA -TABS) 100 MG tablet Take 1 tablet (100 mg total) by mouth 2 (two) times daily. 60 tablet 2   fluconazole  (DIFLUCAN ) 100 MG tablet Take 1 tablet (100 mg total) by mouth daily. Take once as needed for vaginal yeast infection from oral antibiotics. No more than once a week 8 tablet 0   hydroquinone 4 % cream APPLY CREAM TOPICALLY TO AFFECTED AREA ONCE DAILY FOR UP TO 3 MONTHS (Patient taking differently: Takes as needed.)     insulin  degludec (TRESIBA  FLEXTOUCH) 200 UNIT/ML FlexTouch Pen Inject 36 Units into the skin daily. 9 mL 3   ketoconazole (NIZORAL) 2 % cream APPLY CREAM TOPICALLY TWICE DAILY (Patient taking differently: Takes as needed.)     loperamide  (IMODIUM ) 2 MG capsule Take 2 capsules (4 mg total) by mouth as needed for diarrhea or loose stools. , then 1 tab with each additional loose stool as needed.  Do not exceed 8 tabs in 24hr period. 60 capsule 1   magic mouthwash (nystatin , lidocaine , diphenhydrAMINE, alum & mag hydroxide) suspension Take 5 mLs by mouth 4 (four) times daily as needed for mouth pain. Swish & Swallow - Suspension contains equal amounts of Maalox Extra Strength, nystatin , diphenhydramine and lidocaine . 140 mL 1   medroxyPROGESTERone  (DEPO-PROVERA ) 150 MG/ML injection Inject 150 mg into the muscle every 3 (three) months. Reported on 11/06/2015     ondansetron  (ZOFRAN )  8 MG tablet Take 1 tablet (8 mg total) by mouth every 8 (eight) hours as needed for nausea or vomiting. Start on the 3rd day after chemotherapy 20 tablet 1   polyethylene glycol powder (GLYCOLAX/MIRALAX) 17 GM/SCOOP powder Take 17 g by mouth. (Patient taking differently: Take 17 g by mouth. Takes as needed.)     potassium chloride  SA (KLOR-CON  M) 20 MEQ tablet Take 1 tablet (20 mEq total) by mouth daily. 30 tablet 1    prochlorperazine  (COMPAZINE ) 10 MG tablet Take 1 tablet (10 mg total) by mouth every 6 (six) hours as needed for nausea or vomiting. 30 tablet 0   Semaglutide , 1 MG/DOSE, (OZEMPIC , 1 MG/DOSE,) 4 MG/3ML SOPN Inject 1 mg into the skin once a week. 9 mL 2   valACYclovir  (VALTREX ) 500 MG tablet Take 500 mg by mouth 2 (two) times daily. For 7 days as needed     No current facility-administered medications for this visit.   Facility-Administered Medications Ordered in Other Visits  Medication Dose Route Frequency Provider Last Rate Last Admin   0.9 %  sodium chloride  infusion   Intravenous Continuous Lanny Callander, MD 10 mL/hr at 09/14/24 1102 New Bag at 09/14/24 1102   dextrose  5 % solution   Intravenous Continuous Lanny Callander, MD 10 mL/hr at 09/14/24 1506 New Bag at 09/14/24 1506   fluorouracil  (ADRUCIL ) 4,500 mg in sodium chloride  0.9 % 60 mL chemo infusion  2,400 mg/m2 (Treatment Plan Recorded) Intravenous 1 day or 1 dose Lanny Callander, MD       leucovorin  748 mg in dextrose  5 % 250 mL infusion  400 mg/m2 (Treatment Plan Recorded) Intravenous Once Lanny Callander, MD       oxaliplatin  (ELOXATIN ) 150 mg in dextrose  5 % 500 mL chemo infusion  85 mg/m2 (Treatment Plan Recorded) Intravenous Once Lanny Callander, MD        PHYSICAL EXAMINATION: ECOG PERFORMANCE STATUS: 1 - Symptomatic but completely ambulatory  There were no vitals filed for this visit. Wt Readings from Last 3 Encounters:  09/14/24 167 lb 8 oz (76 kg)  08/31/24 163 lb 11.2 oz (74.3 kg)  08/17/24 158 lb 6.4 oz (71.8 kg)     GENERAL:alert, no distress and comfortable SKIN: skin color, texture, turgor are normal, diffuse skin rash on her face, neck and upper chest. EYES: normal, Conjunctiva are pink and non-injected, sclera clear NECK: supple, thyroid  normal size, non-tender, without nodularity LYMPH:  no palpable lymphadenopathy in the cervical, axillary  LUNGS: clear to auscultation and percussion with normal breathing effort HEART: regular rate &  rhythm and no murmurs and no lower extremity edema ABDOMEN:abdomen soft, non-tender and normal bowel sounds Musculoskeletal:no cyanosis of digits and no clubbing  NEURO: alert & oriented x 3 with fluent speech, no focal motor/sensory deficits  Physical Exam    LABORATORY DATA:  I have reviewed the data as listed    Latest Ref Rng & Units 09/14/2024    9:57 AM 08/31/2024    9:25 AM 08/17/2024    8:28 AM  CBC  WBC 4.0 - 10.5 K/uL 7.3  5.0  3.9   Hemoglobin 12.0 - 15.0 g/dL 88.5  89.6  89.4   Hematocrit 36.0 - 46.0 % 35.1  30.7  30.7   Platelets 150 - 400 K/uL 184  162  182         Latest Ref Rng & Units 09/14/2024    9:57 AM 08/31/2024    9:25 AM 08/17/2024    8:28 AM  CMP  Glucose 70 - 99 mg/dL 746  707  787   BUN 6 - 20 mg/dL <5  <5  <5   Creatinine 0.44 - 1.00 mg/dL 9.49  9.51  9.43   Sodium 135 - 145 mmol/L 143  141  143   Potassium 3.5 - 5.1 mmol/L 3.9  3.4  2.8   Chloride 98 - 111 mmol/L 108  107  108   CO2 22 - 32 mmol/L 23  23  25    Calcium  8.9 - 10.3 mg/dL 9.7  9.3  9.4   Total Protein 6.5 - 8.1 g/dL 6.8  6.0  5.8   Total Bilirubin 0.0 - 1.2 mg/dL 0.2  0.2  <9.7   Alkaline Phos 38 - 126 U/L 266  210  174   AST 15 - 41 U/L 26  29  20    ALT 0 - 44 U/L 48  49  29       RADIOGRAPHIC STUDIES: I have personally reviewed the radiological images as listed and agreed with the findings in the report. No results found.    No orders of the defined types were placed in this encounter.  All questions were answered. The patient knows to call the clinic with any problems, questions or concerns. No barriers to learning was detected. The total time spent in the appointment was 25 minutes, including review of chart and various tests results, discussions about plan of care and coordination of care plan     Onita Mattock, MD 09/14/2024     "

## 2024-09-15 ENCOUNTER — Other Ambulatory Visit (HOSPITAL_COMMUNITY): Payer: Self-pay

## 2024-09-16 ENCOUNTER — Inpatient Hospital Stay

## 2024-09-21 ENCOUNTER — Ambulatory Visit (HOSPITAL_COMMUNITY)

## 2024-09-28 ENCOUNTER — Inpatient Hospital Stay

## 2024-09-28 ENCOUNTER — Inpatient Hospital Stay: Admitting: Hematology

## 2024-09-30 ENCOUNTER — Inpatient Hospital Stay
